# Patient Record
Sex: Female | Born: 1935 | Race: Black or African American | Hispanic: No | Smoking: Former smoker
Health system: Southern US, Community
[De-identification: ages and names within clinical notes are randomized; demographics above are authoritative.]

## PROBLEM LIST (undated history)

## (undated) DIAGNOSIS — B019 Varicella without complication: Secondary | ICD-10-CM

## (undated) DIAGNOSIS — Z972 Presence of dental prosthetic device (complete) (partial): Secondary | ICD-10-CM

## (undated) DIAGNOSIS — D649 Anemia, unspecified: Secondary | ICD-10-CM

## (undated) DIAGNOSIS — I10 Essential (primary) hypertension: Secondary | ICD-10-CM

## (undated) DIAGNOSIS — E785 Hyperlipidemia, unspecified: Secondary | ICD-10-CM

## (undated) DIAGNOSIS — K5730 Diverticulosis of large intestine without perforation or abscess without bleeding: Secondary | ICD-10-CM

## (undated) DIAGNOSIS — Z973 Presence of spectacles and contact lenses: Secondary | ICD-10-CM

## (undated) DIAGNOSIS — K219 Gastro-esophageal reflux disease without esophagitis: Secondary | ICD-10-CM

## (undated) HISTORY — DX: Anemia, unspecified: D64.9

## (undated) HISTORY — DX: Diverticulosis of large intestine without perforation or abscess without bleeding: K57.30

## (undated) HISTORY — DX: Varicella without complication: B01.9

## (undated) HISTORY — DX: Gastro-esophageal reflux disease without esophagitis: K21.9

## (undated) HISTORY — PX: BREAST LUMPECTOMY: SHX2

## (undated) HISTORY — PX: OTHER SURGICAL HISTORY: SHX169

## (undated) HISTORY — PX: ABDOMINAL HYSTERECTOMY: SHX81

## (undated) HISTORY — DX: Essential (primary) hypertension: I10

## (undated) HISTORY — DX: Hyperlipidemia, unspecified: E78.5

---

## 2009-02-27 ENCOUNTER — Ambulatory Visit: Payer: Self-pay | Admitting: Family Medicine

## 2009-03-07 ENCOUNTER — Ambulatory Visit: Payer: Self-pay | Admitting: Oncology

## 2009-03-12 LAB — CBC & DIFF AND RETIC
Basophils Absolute: 0.1 10*3/uL (ref 0.0–0.1)
EOS%: 3.4 % (ref 0.0–7.0)
Eosinophils Absolute: 0.1 10*3/uL (ref 0.0–0.5)
Immature Retic Fract: 8 % (ref 0.00–10.70)
NEUT%: 58.6 % (ref 38.4–76.8)
RBC: 4.04 10*6/uL (ref 3.70–5.45)
Retic %: 1.28 % (ref 0.50–1.50)
lymph#: 1.1 10*3/uL (ref 0.9–3.3)

## 2009-03-12 LAB — TECHNOLOGIST REVIEW

## 2009-03-14 LAB — COMPREHENSIVE METABOLIC PANEL
Albumin: 4.5 g/dL (ref 3.5–5.2)
Alkaline Phosphatase: 58 U/L (ref 39–117)
BUN: 21 mg/dL (ref 6–23)
Creatinine, Ser: 1.13 mg/dL (ref 0.40–1.20)
Glucose, Bld: 137 mg/dL — ABNORMAL HIGH (ref 70–99)
Potassium: 3.7 mEq/L (ref 3.5–5.3)
Total Bilirubin: 0.8 mg/dL (ref 0.3–1.2)
Total Protein: 7.2 g/dL (ref 6.0–8.3)

## 2009-03-14 LAB — LACTATE DEHYDROGENASE: LDH: 162 U/L (ref 94–250)

## 2009-03-14 LAB — SPEP & IFE WITH QIG
Albumin ELP: 58.4 % (ref 55.8–66.1)
Gamma Globulin: 17.5 % (ref 11.1–18.8)
IgM, Serum: 72 mg/dL (ref 60–263)
M-Spike, %: 0.23 g/dL

## 2009-03-14 LAB — IRON AND TIBC: %SAT: 28 % (ref 20–55)

## 2009-03-20 LAB — FECAL OCCULT BLOOD, GUAIAC: Occult Blood: NEGATIVE

## 2009-04-07 ENCOUNTER — Ambulatory Visit: Payer: Self-pay | Admitting: Oncology

## 2009-04-29 ENCOUNTER — Ambulatory Visit: Payer: Self-pay | Admitting: Family Medicine

## 2009-07-08 ENCOUNTER — Ambulatory Visit: Payer: Self-pay | Admitting: Oncology

## 2009-07-10 LAB — COMPREHENSIVE METABOLIC PANEL
AST: 29 U/L (ref 0–37)
Calcium: 10.1 mg/dL (ref 8.4–10.5)
Chloride: 103 mEq/L (ref 96–112)
Creatinine, Ser: 0.94 mg/dL (ref 0.40–1.20)
Potassium: 3.8 mEq/L (ref 3.5–5.3)
Total Protein: 7.4 g/dL (ref 6.0–8.3)

## 2009-07-10 LAB — CBC WITH DIFFERENTIAL/PLATELET
BASO%: 1.1 % (ref 0.0–2.0)
Basophils Absolute: 0.1 10*3/uL (ref 0.0–0.1)
MCH: 28.3 pg (ref 25.1–34.0)
MCHC: 32.5 g/dL (ref 31.5–36.0)
MCV: 87 fL (ref 79.5–101.0)
NEUT%: 46.2 % (ref 38.4–76.8)
RDW: 13.8 % (ref 11.2–14.5)
WBC: 4.5 10*3/uL (ref 3.9–10.3)

## 2009-07-28 ENCOUNTER — Ambulatory Visit: Payer: Self-pay | Admitting: Family Medicine

## 2009-12-03 ENCOUNTER — Ambulatory Visit: Payer: Self-pay | Admitting: Family Medicine

## 2009-12-09 ENCOUNTER — Encounter: Admission: RE | Admit: 2009-12-09 | Discharge: 2009-12-09 | Payer: Self-pay | Admitting: Obstetrics & Gynecology

## 2010-01-09 ENCOUNTER — Ambulatory Visit: Payer: Self-pay | Admitting: Oncology

## 2010-01-13 LAB — CBC WITH DIFFERENTIAL/PLATELET
BASO%: 0.9 % (ref 0.0–2.0)
Eosinophils Absolute: 0.2 10*3/uL (ref 0.0–0.5)
HCT: 37.8 % (ref 34.8–46.6)
LYMPH%: 43.5 % (ref 14.0–49.7)
MCHC: 33.6 g/dL (ref 31.5–36.0)
MONO#: 0.4 10*3/uL (ref 0.1–0.9)
MONO%: 8.2 % (ref 0.0–14.0)
NEUT#: 1.9 10*3/uL (ref 1.5–6.5)
RBC: 4.36 10*6/uL (ref 3.70–5.45)
WBC: 4.3 10*3/uL (ref 3.9–10.3)
lymph#: 1.9 10*3/uL (ref 0.9–3.3)

## 2010-01-13 LAB — COMPREHENSIVE METABOLIC PANEL
Albumin: 4.3 g/dL (ref 3.5–5.2)
Alkaline Phosphatase: 50 U/L (ref 39–117)
BUN: 15 mg/dL (ref 6–23)
CO2: 27 mEq/L (ref 19–32)
Chloride: 101 mEq/L (ref 96–112)
Creatinine, Ser: 1.06 mg/dL (ref 0.40–1.20)
Potassium: 3.8 mEq/L (ref 3.5–5.3)
Total Bilirubin: 0.8 mg/dL (ref 0.3–1.2)

## 2010-04-02 ENCOUNTER — Ambulatory Visit: Payer: Self-pay | Admitting: Family Medicine

## 2010-07-02 ENCOUNTER — Ambulatory Visit (INDEPENDENT_AMBULATORY_CARE_PROVIDER_SITE_OTHER): Payer: Federal, State, Local not specified - PPO | Admitting: Family Medicine

## 2010-07-02 DIAGNOSIS — I10 Essential (primary) hypertension: Secondary | ICD-10-CM

## 2010-07-02 DIAGNOSIS — E119 Type 2 diabetes mellitus without complications: Secondary | ICD-10-CM

## 2010-07-02 DIAGNOSIS — E7800 Pure hypercholesterolemia, unspecified: Secondary | ICD-10-CM

## 2010-07-02 DIAGNOSIS — Z79899 Other long term (current) drug therapy: Secondary | ICD-10-CM

## 2010-07-29 ENCOUNTER — Encounter (HOSPITAL_BASED_OUTPATIENT_CLINIC_OR_DEPARTMENT_OTHER): Payer: Federal, State, Local not specified - PPO | Admitting: Oncology

## 2010-07-29 ENCOUNTER — Other Ambulatory Visit: Payer: Self-pay | Admitting: Oncology

## 2010-07-29 DIAGNOSIS — D696 Thrombocytopenia, unspecified: Secondary | ICD-10-CM

## 2010-07-29 DIAGNOSIS — D649 Anemia, unspecified: Secondary | ICD-10-CM

## 2010-07-29 LAB — CBC WITH DIFFERENTIAL/PLATELET
BASO%: 0.7 % (ref 0.0–2.0)
HCT: 37.9 % (ref 34.8–46.6)
LYMPH%: 50.9 % — ABNORMAL HIGH (ref 14.0–49.7)
MCH: 30.1 pg (ref 25.1–34.0)
MCHC: 33.9 g/dL (ref 31.5–36.0)
MCV: 88.6 fL (ref 79.5–101.0)
MONO#: 0.2 10*3/uL (ref 0.1–0.9)
MONO%: 4.7 % (ref 0.0–14.0)
NEUT#: 1.6 10*3/uL (ref 1.5–6.5)
NEUT%: 39.3 % (ref 38.4–76.8)
Platelets: 107 10*3/uL — ABNORMAL LOW (ref 145–400)
RBC: 4.27 10*6/uL (ref 3.70–5.45)
RDW: 14.3 % (ref 11.2–14.5)
WBC: 4 10*3/uL (ref 3.9–10.3)
lymph#: 2 10*3/uL (ref 0.9–3.3)

## 2010-07-29 LAB — IRON AND TIBC
TIBC: 323 ug/dL (ref 250–470)
UIBC: 218 ug/dL

## 2010-08-01 ENCOUNTER — Encounter: Payer: Self-pay | Admitting: Family Medicine

## 2010-09-02 ENCOUNTER — Ambulatory Visit: Payer: Federal, State, Local not specified - PPO | Admitting: Family Medicine

## 2010-11-09 ENCOUNTER — Telehealth: Payer: Self-pay | Admitting: *Deleted

## 2010-11-09 NOTE — Telephone Encounter (Signed)
Called patient to try to schedule appt per Dr.Knapp as pt canceled appt from 09/02/10. Patient stated that she is going to transfer out to another doctor and would not schedule appointment.

## 2011-01-14 ENCOUNTER — Encounter (HOSPITAL_BASED_OUTPATIENT_CLINIC_OR_DEPARTMENT_OTHER): Payer: Federal, State, Local not specified - PPO | Admitting: Oncology

## 2011-01-14 ENCOUNTER — Other Ambulatory Visit: Payer: Self-pay | Admitting: Oncology

## 2011-01-14 DIAGNOSIS — D649 Anemia, unspecified: Secondary | ICD-10-CM

## 2011-01-14 DIAGNOSIS — D696 Thrombocytopenia, unspecified: Secondary | ICD-10-CM

## 2011-01-14 LAB — CBC WITH DIFFERENTIAL/PLATELET
BASO%: 0.8 % (ref 0.0–2.0)
HCT: 38.1 % (ref 34.8–46.6)
MCHC: 33.8 g/dL (ref 31.5–36.0)
MCV: 88.5 fL (ref 79.5–101.0)
MONO%: 5.7 % (ref 0.0–14.0)
NEUT#: 2.2 10*3/uL (ref 1.5–6.5)
Platelets: 108 10*3/uL — ABNORMAL LOW (ref 145–400)
RDW: 14 % (ref 11.2–14.5)
WBC: 4.7 10*3/uL (ref 3.9–10.3)

## 2011-02-08 ENCOUNTER — Ambulatory Visit (INDEPENDENT_AMBULATORY_CARE_PROVIDER_SITE_OTHER): Payer: Federal, State, Local not specified - PPO | Admitting: Internal Medicine

## 2011-02-08 ENCOUNTER — Encounter: Payer: Self-pay | Admitting: Internal Medicine

## 2011-02-08 VITALS — BP 156/90 | HR 75 | Temp 97.9°F | Ht 62.0 in | Wt 128.0 lb

## 2011-02-08 DIAGNOSIS — Z23 Encounter for immunization: Secondary | ICD-10-CM

## 2011-02-08 DIAGNOSIS — G4700 Insomnia, unspecified: Secondary | ICD-10-CM

## 2011-02-08 DIAGNOSIS — E785 Hyperlipidemia, unspecified: Secondary | ICD-10-CM

## 2011-02-08 DIAGNOSIS — D649 Anemia, unspecified: Secondary | ICD-10-CM | POA: Insufficient documentation

## 2011-02-08 DIAGNOSIS — I10 Essential (primary) hypertension: Secondary | ICD-10-CM | POA: Insufficient documentation

## 2011-02-08 DIAGNOSIS — E119 Type 2 diabetes mellitus without complications: Secondary | ICD-10-CM

## 2011-02-08 NOTE — Assessment & Plan Note (Signed)
Reviewed rules of sleep hygiene: regular hours; avoidance of all stimulants; regular exercise; sleep santuary - no other activities in bed; avoidance of extinction behavior - laying in bed awake, naps, etc.   Plan - improved sleep hygiene           Medications if this fails.

## 2011-02-08 NOTE — Progress Notes (Signed)
Subjective:    Patient ID: Samantha Norton, female    DOB: Nov 12, 1931, 77 y.o.   MRN: [id number]  HPI Mrs. Spurling to establish for primary care. She is feeling well and has no specific complaints.  She does report a problem with insomnia: both sleep latency and duration. Oriented to sleep hygiene.   Past Medical History  Diagnosis Date  . Hypertension   . Diabetes mellitus   . Hyperlipidemia   . Varicella     in high school  . Anemia     thrombocytopenia, seen by hematology.  . Diverticulosis of colon   . GERD (gastroesophageal reflux disease)     no medications. Last EGD 2+ years ago. Had H. Pylori  . Osteoporosis     by patient report - osteopenia   Past Surgical History  Procedure Date  . Breast lumpectomy     bilateral biospsies over time x 3-4 , always beng  . Tonsilectomy   . Abdominal hysterectomy     partial, due to fibroid tumors.   Family History  Problem Relation Age of Onset  . Hypertension Mother   . Stroke Mother   . Diabetes Mother   . Stroke Father   . Hypertension Father   . Arthritis Brother     hip problems, in w/c  . Cancer Daughter     breast   History   Social History  . Marital Status: Divorced    Spouse Name: N/A    Number of Children: 7  . Years of Education: 13   Occupational History  . Postal clerk     retired at 39 years   Social History Main Topics  . Smoking status: Former Smoker    Quit date: 02/08/1972  . Smokeless tobacco: Never Used  . Alcohol Use: Yes     rare glass of wine  . Drug Use: No  . Sexually Active: Not Currently   Other Topics Concern  . Not on file   Social History Narrative   HSG, 1 year [employer]. Married '52 - 22 yrs/divorced. 4 drtrs  '52, '54, '58, '60; 3 sons - '59, '62, '66; grandchildren 16; a lot of great-grands 15. Native of [place]. 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 Closest relative - a son in [place]. No futile or heroic measures but not decided about the details of CPR.  Would not want long-term mechanical ventilation.          Review of Systems Constitutional:  Negative for fever, chills, activity change and unexpected weight change.  HEENT:  Negative for hearing loss, ear pain, congestion, neck stiffness and postnasal drip. Negative for sore throat or swallowing problems. Negative for dental complaints.   Eyes: Negative for vision loss or change in visual acuity.  Respiratory: Negative for chest tightness and wheezing. Negative for DOE.   Cardiovascular: Negative for chest pain or palpitations. No decreased exercise tolerance Gastrointestinal: No change in bowel habit. No bloating or gas. No reflux or indigestion Genitourinary: Negative for urgency, frequency, flank pain and difficulty urinating.  Musculoskeletal: Negative for myalgias, back pain, arthralgias and gait problem. Positive for contracture right hand, less so left hand. Neurological: Negative for dizziness, tremors, weakness and headaches.  Hematological: Negative for adenopathy.  Psychiatric/Behavioral: Negative for behavioral problems and dysphoric mood.       Objective:   Physical Exam Vitals reviewed - mild elevation in BP Gen'l - WNWD AA woman in no distress HEENT- [place]/AT, C&S clear, Right eye - recent  cataract surgery. Left eye appears normal. Oropharynx - upper denture, normal dentition mandible. Neck- supple, w/o thyromegaly Chest- no deformity Lungs - CTAP, no wheezes Cor- RRR without murmurs or gallops Breast exam - deferred to gyn Abdomen- soft, no guarding or rebound, no HSM Pelvic/rectal deferred to gyn Ext - normal Skin clear. Neuro - A&O x 3, CN II-XII normal, normal strength, normal gait.              Assessment & Plan:

## 2011-02-09 NOTE — Assessment & Plan Note (Signed)
Current with hematology and this problem is stable.

## 2011-02-09 NOTE — Assessment & Plan Note (Signed)
BP Readings from Last 3 Encounters:  02/08/11 156/90  07/02/10 164/98   Suboptimal control on ARB/hct.  Plan - home monitoring with report back. If home readings confirm elevation will need to address medication change.

## 2011-02-09 NOTE — Assessment & Plan Note (Signed)
Cholesterol panel ordered and pending. Recommendations to be based on lab results.

## 2011-03-11 ENCOUNTER — Other Ambulatory Visit: Payer: Self-pay | Admitting: Internal Medicine

## 2011-03-11 ENCOUNTER — Other Ambulatory Visit (INDEPENDENT_AMBULATORY_CARE_PROVIDER_SITE_OTHER): Payer: Federal, State, Local not specified - PPO

## 2011-03-11 ENCOUNTER — Ambulatory Visit (INDEPENDENT_AMBULATORY_CARE_PROVIDER_SITE_OTHER)
Admission: RE | Admit: 2011-03-11 | Discharge: 2011-03-11 | Disposition: A | Discharge status: Home / Self Care | Payer: Federal, State, Local not specified - PPO | Source: Ambulatory Visit | Attending: Internal Medicine | Admitting: Internal Medicine

## 2011-03-11 ENCOUNTER — Ambulatory Visit (INDEPENDENT_AMBULATORY_CARE_PROVIDER_SITE_OTHER): Payer: Federal, State, Local not specified - PPO | Admitting: Internal Medicine

## 2011-03-11 VITALS — BP 142/82 | HR 67 | Temp 98.0°F | Ht 62.0 in | Wt 132.0 lb

## 2011-03-11 DIAGNOSIS — E785 Hyperlipidemia, unspecified: Secondary | ICD-10-CM

## 2011-03-11 DIAGNOSIS — E119 Type 2 diabetes mellitus without complications: Secondary | ICD-10-CM

## 2011-03-11 DIAGNOSIS — M549 Dorsalgia, unspecified: Secondary | ICD-10-CM

## 2011-03-11 DIAGNOSIS — I10 Essential (primary) hypertension: Secondary | ICD-10-CM

## 2011-03-11 DIAGNOSIS — D649 Anemia, unspecified: Secondary | ICD-10-CM

## 2011-03-11 LAB — COMPREHENSIVE METABOLIC PANEL
ALT: 22 U/L (ref 0–35)
AST: 28 U/L (ref 0–37)
Albumin: 4 g/dL (ref 3.5–5.2)
Alkaline Phosphatase: 53 U/L (ref 39–117)
BUN: 17 mg/dL (ref 6–23)
Calcium: 9.8 mg/dL (ref 8.4–10.5)
GFR: 76.98 mL/min (ref >60.00)
Potassium: 3.7 mEq/L (ref 3.5–5.1)
Total Bilirubin: 0.9 mg/dL (ref 0.3–1.2)
Total Protein: 7.1 g/dL (ref 6.0–8.3)

## 2011-03-11 LAB — LIPID PANEL
Cholesterol: 244 mg/dL — ABNORMAL HIGH (ref 0–200)
HDL: 70.7 mg/dL (ref >39.00)

## 2011-03-11 LAB — HEMOGLOBIN A1C: Hgb A1c MFr Bld: 6.2 % (ref 4.6–6.5)

## 2011-03-11 MED ORDER — IRBESARTAN-HYDROCHLOROTHIAZIDE 300-25 MG PO TABS: 1.0000 | ORAL_TABLET | Freq: Every day | ORAL | Status: DC

## 2011-03-11 NOTE — Progress Notes (Signed)
  Subjective:    Patient ID: Samantha Norton, female    DOB: November 01, 1931, 77 y.o.   MRN: [id number]  HPI Mrs. Rotolo returns for new patient follow-up. She did not receive a copy of the original office note, which is given to her today. She was to have lab work for cholesterol, anemia and hypertension which she forgot to do.   Her chief c/o today is a tenderness in the mid back. She has no limitation in her activities. There is no radiation of discomfort. She has had no injur.  I have reviewed the patient's medical history in detail and updated the computerized patient record.    Review of Systems System review is negative for any constitutional, cardiac, pulmonary, GI or neuro symptoms or complaints other than as described in the HPI.    Objective:   Physical Exam Vitals - normal Gen'l - WNWD AA woman Pulm - normal respirations Cor - RRR Back - tender at T9-10 level in the soft tissue to the left of midline.        Assessment & Plan:

## 2011-03-11 NOTE — Patient Instructions (Signed)
Thanks for coming back. If you have any questions about the report I gave you please let me know.  For your back discomfort - will check an x-ray today.  Please get your lab work today - you will receive a report and recommendations as appropriate.

## 2011-03-11 NOTE — Assessment & Plan Note (Signed)
For lab with recommendations to follow. 

## 2011-03-11 NOTE — Assessment & Plan Note (Signed)
BP Readings from Last 3 Encounters:  03/11/11 142/82  02/08/11 156/90  07/02/10 164/98   Better control today.  Renewed avalide She is to go for lab today

## 2011-03-11 NOTE — Assessment & Plan Note (Signed)
For lab today with copy to hematology

## 2011-03-15 ENCOUNTER — Telehealth: Payer: Self-pay | Admitting: *Deleted

## 2011-03-15 NOTE — Telephone Encounter (Signed)
avalide does not come in strength of 300/25 does in 300/12.5 please Advise  [phone] reference number [id number]

## 2011-03-15 NOTE — Telephone Encounter (Signed)
300/12.5 is ok, # 30 refill prn. Correct MAR

## 2011-03-16 MED ORDER — IRBESARTAN-HYDROCHLOROTHIAZIDE 300-12.5 MG PO TABS: 1.0000 | ORAL_TABLET | Freq: Every day | ORAL | Status: DC

## 2011-03-16 NOTE — Telephone Encounter (Signed)
Spoke with Clydie Braun at [facility] and verified that Avalide 300/12.5 mg

## 2011-03-22 ENCOUNTER — Encounter: Payer: Self-pay | Admitting: Internal Medicine

## 2011-04-15 ENCOUNTER — Ambulatory Visit (INDEPENDENT_AMBULATORY_CARE_PROVIDER_SITE_OTHER): Payer: Federal, State, Local not specified - PPO | Admitting: Internal Medicine

## 2011-04-15 ENCOUNTER — Encounter: Payer: Self-pay | Admitting: Internal Medicine

## 2011-04-15 VITALS — BP 128/88 | HR 55 | Temp 98.3°F | Resp 14 | Wt 131.1 lb

## 2011-04-15 DIAGNOSIS — E785 Hyperlipidemia, unspecified: Secondary | ICD-10-CM

## 2011-04-15 NOTE — Patient Instructions (Signed)
Cholesterol - the total cholestero @ 244 is above normal of 200. The HDL (good cholesterol) is 70, with normal being 40 +, the LDL is 152.6 with a goal of 130 or less. Your LDL/HDL is 2.2 with average risk for heart disease being 3-3.5. The 10 year risk of a cardiac event using the Framingham cardiac risk calculator that is based on the total cholesterol, the HDL, the systolic blood pressure, age and gender is 7% - low risk category.  Recommendation - continue a healthy low fat diet, improve it if you can; continue to walk. Repeat panel in 6 months  Bl,ood pressure - good control. Chest x-ray does reveal mild to moderate left ventricular hypertrophy - boot shaped heart-due to long standing high blood pressure.  Overall I would say you are really good condition for the condition that you are in.

## 2011-04-18 NOTE — Assessment & Plan Note (Signed)
Lab Results  Component Value Date   CHOL 244* 03/11/2011   HDL 70.70 03/11/2011   LDLDIRECT 152.6 03/11/2011   TRIG 68.0 03/11/2011   CHOLHDL 3 03/11/2011   Reviewed NCEP guidelines with her. NCEP cardiac risk calculation w/ a 10 year risk of 7%  = low risk.  Plan- life-style management: diet and continued exercise.          Recheck lipids 6 months  (greater than 50% of 20 min  visit spent on education and counseling)

## 2011-04-18 NOTE — Progress Notes (Signed)
  Subjective:    Patient ID: Samantha Norton, female    DOB: 1931-11-05, 78 y.o.   MRN: [id number]  HPI Patient presents to discuss recent labs with a elevated LDL cholesterol. She is feeling well.  I have reviewed the patient's medical history in detail and updated the computerized patient record.  Review of Systems System review is negative for any constitutional, cardiac, pulmonary, GI or neuro symptoms or complaints other than as described in the HPI.     Objective:   Physical Exam Filed Vitals:   04/15/11 1329  BP: 128/88  Pulse: 55  Temp: 98.3 F (36.8 C)  Resp: 14   WNWD AA woman in no distress Pulm- normal respirations Cor - RRR Neuro - bright and alert.       Assessment & Plan:

## 2011-05-11 ENCOUNTER — Telehealth: Payer: Self-pay | Admitting: Oncology

## 2011-05-11 NOTE — Telephone Encounter (Signed)
lmonvm adviisng the pt of her march 2013 appts °

## 2011-08-11 ENCOUNTER — Ambulatory Visit (HOSPITAL_BASED_OUTPATIENT_CLINIC_OR_DEPARTMENT_OTHER): Payer: Federal, State, Local not specified - PPO | Admitting: Oncology

## 2011-08-11 ENCOUNTER — Other Ambulatory Visit (HOSPITAL_BASED_OUTPATIENT_CLINIC_OR_DEPARTMENT_OTHER): Payer: Federal, State, Local not specified - PPO | Admitting: Lab

## 2011-08-11 ENCOUNTER — Telehealth: Payer: Self-pay | Admitting: Oncology

## 2011-08-11 VITALS — BP 193/90 | HR 62 | Temp 97.6°F | Ht 62.0 in | Wt 132.8 lb

## 2011-08-11 DIAGNOSIS — D649 Anemia, unspecified: Secondary | ICD-10-CM

## 2011-08-11 DIAGNOSIS — D696 Thrombocytopenia, unspecified: Secondary | ICD-10-CM

## 2011-08-11 LAB — CBC WITH DIFFERENTIAL/PLATELET
BASO%: 2.1 % — ABNORMAL HIGH (ref 0.0–2.0)
Eosinophils Absolute: 0.2 10*3/uL (ref 0.0–0.5)
HCT: 37.7 % (ref 34.8–46.6)
LYMPH%: 39.8 % (ref 14.0–49.7)
MCHC: 32.3 g/dL (ref 31.5–36.0)
MONO#: 0.3 10*3/uL (ref 0.1–0.9)
NEUT%: 47.4 % (ref 38.4–76.8)
Platelets: 102 10*3/uL — ABNORMAL LOW (ref 145–400)
WBC: 3.9 10*3/uL (ref 3.9–10.3)

## 2011-08-11 LAB — COMPREHENSIVE METABOLIC PANEL
BUN: 20 mg/dL (ref 6–23)
CO2: 30 mEq/L (ref 19–32)
Calcium: 9.7 mg/dL (ref 8.4–10.5)
Chloride: 103 mEq/L (ref 96–112)
Creatinine, Ser: 0.95 mg/dL (ref 0.50–1.10)

## 2011-08-11 NOTE — Progress Notes (Signed)
Hematology and Oncology Follow Up Visit  Shandelle Borrelli [id number] [birth year] 78 y.o. 08/11/2011 3:28 PM     Principle Diagnosis: This is a 78 year old female initially diagnosed with normocytic normochromic anemia and mild thrombocytopenia diagnosed in 2010.  Current therapy: Observation and surveillance.  Her hemoglobins have normalized.  Interim History:  Ms. Olivier presents today for a followup visit.  This is a pleasant 78 year old female who we saw initially back in December 2010 for evaluation of her  anemia and thrombocytopenia.  Her hemoglobins have been as high as 12.9 since that time and did not really require any intervention.  She does have mild thrombocytopenia as well.  Platelet count ranged between 100-120,000.  Since the last time I saw her, she had not reported any major changes in her performance status, had not had any appetite changes, had not had any constitutional symptoms. She did report very small bruises on her arm that are noticeable today. No bleeding noted.   Activity level remains stable.   Medications: I have reviewed the patient's current medications. Current outpatient prescriptions:aspirin 81 MG tablet, Take 81 mg by mouth daily.  , Disp: , Rfl: ;  calcium-vitamin D (OSCAL WITH D) 250-125 MG-UNIT per tablet, Take 1 tablet by mouth daily.  , Disp: , Rfl: ;  irbesartan-hydrochlorothiazide (AVALIDE) 300-12.5 MG per tablet, Take 1 tablet by mouth daily., Disp: 90 tablet, Rfl: 3;  irbesartan-hydrochlorothiazide (AVALIDE) 300-25 MG per tablet, Take 1 tablet by mouth daily., Disp: 90 tablet, Rfl: 3 multivitamin (THERAGRAN) per tablet, Take 1 tablet by mouth daily.  , Disp: , Rfl: ;  omega-3 acid ethyl esters (LOVAZA) 1 G capsule, Take 1 g by mouth daily.  , Disp: , Rfl:   Allergies: No Known Allergies  Past Medical History, Surgical history, Social history, and Family History were reviewed and updated.  Review of Systems: Constitutional:  Negative for fever, chills, night  sweats, anorexia, weight loss, pain. Cardiovascular: no chest pain or dyspnea on exertion Respiratory: negative Neurological: negative Dermatological: negative ENT: negative Skin: Negative. Gastrointestinal: negative Genito-Urinary: negative Hematological and Lymphatic: negative Breast: negative Musculoskeletal: negative Remaining ROS negative. Physical Exam: Blood pressure 193/90, pulse 62, temperature 97.6 F (36.4 C), temperature source Oral, height 5\' 2"  (1.575 m), weight 132 lb 12.8 oz (60.238 kg). ECOG: 1 General appearance: alert Head: Normocephalic, without obvious abnormality, atraumatic Neck: no adenopathy, no carotid bruit, no JVD, supple, symmetrical, trachea midline and thyroid not enlarged, symmetric, no tenderness/mass/nodules Lymph nodes: Cervical, supraclavicular, and axillary nodes normal. Heart:regular rate and rhythm, S1, S2 normal, no murmur, click, rub or gallop Lung:chest clear, no wheezing, rales, normal symmetric air entry. Abdomin: soft, non-tender, without masses or organomegaly EXT:no erythema, induration, or nodules Skin: no rash, no petechia    Lab Results: Lab Results  Component Value Date   WBC 3.9 08/11/2011   HGB 12.2 08/11/2011   HCT 37.7 08/11/2011   MCV 89.4 08/11/2011   PLT 102* 08/11/2011     Chemistry      Component Value Date/Time   NA 142 03/11/2011 1224   K 3.7 03/11/2011 1224   CL 107 03/11/2011 1224   CO2 29 03/11/2011 1224   BUN 17 03/11/2011 1224   CREATININE 0.9 03/11/2011 1224      Component Value Date/Time   CALCIUM 9.8 03/11/2011 1224   ALKPHOS 53 03/11/2011 1224   AST 28 03/11/2011 1224   ALT 22 03/11/2011 1224   BILITOT 0.9 03/11/2011 1224       Impression and Plan:  This is a 78 year old female with the following issues. 1. Anemia.  It was normochromic normocytic in the past, but have been really normal in the last 2 years.  I do not see any evidence of any vitamin deficiency.  Really no further workup in  management from an anemia standpoint at this time. 2. Mild thrombocytopenia.  Platelet counts have been above 100,000.  Differential diagnosis include medication versus autoimmune phenomenon such as idiopathic thrombocytopenic purpura.  I doubt that this is an early sign of myelodysplasia, but will continue to monitor it.  Repeat blood counts in about 6 months.   [facility], MD 5/1/20133:28 PM

## 2011-08-11 NOTE — Telephone Encounter (Signed)
Gv pt appt for nov2013 °

## 2011-10-05 ENCOUNTER — Other Ambulatory Visit (INDEPENDENT_AMBULATORY_CARE_PROVIDER_SITE_OTHER): Payer: Federal, State, Local not specified - PPO

## 2011-10-05 DIAGNOSIS — E785 Hyperlipidemia, unspecified: Secondary | ICD-10-CM

## 2011-10-05 LAB — LIPID PANEL
Cholesterol: 213 mg/dL — ABNORMAL HIGH (ref 0–200)
HDL: 74.5 mg/dL (ref >39.00)
VLDL: 16.2 mg/dL (ref 0.0–40.0)

## 2011-10-10 ENCOUNTER — Encounter: Payer: Self-pay | Admitting: Internal Medicine

## 2011-12-27 ENCOUNTER — Telehealth: Payer: Self-pay | Admitting: Internal Medicine

## 2011-12-27 NOTE — Telephone Encounter (Signed)
Forward 7 pages from [facility] ENT to Dr. Illene Regulus for review on 12-27-11 ym

## 2011-12-27 NOTE — Telephone Encounter (Signed)
Forward 4 pages from [facility] ENT to Dr. Illene Regulus for review on 12-27-11 ym

## 2012-02-11 ENCOUNTER — Telehealth: Payer: Self-pay | Admitting: Oncology

## 2012-02-11 ENCOUNTER — Ambulatory Visit (HOSPITAL_BASED_OUTPATIENT_CLINIC_OR_DEPARTMENT_OTHER): Payer: Federal, State, Local not specified - PPO | Admitting: Oncology

## 2012-02-11 ENCOUNTER — Other Ambulatory Visit (HOSPITAL_BASED_OUTPATIENT_CLINIC_OR_DEPARTMENT_OTHER): Payer: Federal, State, Local not specified - PPO | Admitting: Lab

## 2012-02-11 VITALS — BP 171/78 | HR 60 | Temp 98.5°F | Resp 20 | Ht 62.0 in | Wt 130.5 lb

## 2012-02-11 DIAGNOSIS — D696 Thrombocytopenia, unspecified: Secondary | ICD-10-CM

## 2012-02-11 DIAGNOSIS — D649 Anemia, unspecified: Secondary | ICD-10-CM

## 2012-02-11 LAB — COMPREHENSIVE METABOLIC PANEL (CC13)
ALT: 40 U/L (ref 0–55)
AST: 38 U/L — ABNORMAL HIGH (ref 5–34)
Albumin: 3.8 g/dL (ref 3.5–5.0)
Alkaline Phosphatase: 67 U/L (ref 40–150)
Glucose: 165 mg/dl — ABNORMAL HIGH (ref 70–99)
Potassium: 4.1 mEq/L (ref 3.5–5.1)
Sodium: 141 mEq/L (ref 136–145)
Total Bilirubin: 0.82 mg/dL (ref 0.20–1.20)
Total Protein: 7 g/dL (ref 6.4–8.3)

## 2012-02-11 LAB — CHCC SMEAR

## 2012-02-11 LAB — CBC WITH DIFFERENTIAL/PLATELET
BASO%: 1.8 % (ref 0.0–2.0)
LYMPH%: 38.3 % (ref 14.0–49.7)
MCHC: 33.3 g/dL (ref 31.5–36.0)
MCV: 88.5 fL (ref 79.5–101.0)
MONO%: 6.6 % (ref 0.0–14.0)
Platelets: 103 10*3/uL — ABNORMAL LOW (ref 145–400)
RBC: 4.31 10*6/uL (ref 3.70–5.45)

## 2012-02-11 NOTE — Telephone Encounter (Signed)
appts made and printed for pt  °

## 2012-02-11 NOTE — Progress Notes (Signed)
Hematology and Oncology Follow Up Visit  Samantha Norton [id number] Nov 10, 1931 78 y.o. 02/11/2012 3:10 PM     Principle Diagnosis: This is a 78 year old female initially diagnosed with normocytic normochromic anemia and mild thrombocytopenia diagnosed in 2010.  Current therapy: Observation and surveillance.  Her hemoglobins have normalized.  Interim History:  Samantha Norton presents today for a followup visit.  This is a pleasant 78 year old female who we saw initially back in December 2010 for evaluation of her  anemia and thrombocytopenia.  Her hemoglobins have been as high as 12.9 since that time and did not really require any intervention.  She does have mild thrombocytopenia as well.  Platelet count ranged between 100-120,000.  Since the last time I saw her, she had not reported any major changes in her performance status, had not had any appetite changes, had not had any constitutional symptoms.  Activity level remains stable at this point.    Medications: I have reviewed the patient's current medications. Current outpatient prescriptions:aspirin 81 MG tablet, Take 81 mg by mouth daily.  , Disp: , Rfl: ;  calcium-vitamin D (OSCAL WITH D) 250-125 MG-UNIT per tablet, Take 1 tablet by mouth daily.  , Disp: , Rfl: ;  irbesartan-hydrochlorothiazide (AVALIDE) 300-12.5 MG per tablet, Take 1 tablet by mouth daily., Disp: 90 tablet, Rfl: 3;  irbesartan-hydrochlorothiazide (AVALIDE) 300-25 MG per tablet, Take 1 tablet by mouth daily., Disp: 90 tablet, Rfl: 3 multivitamin (THERAGRAN) per tablet, Take 1 tablet by mouth daily.  , Disp: , Rfl: ;  omega-3 acid ethyl esters (LOVAZA) 1 G capsule, Take 1 g by mouth daily.  , Disp: , Rfl:   Allergies: No Known Allergies  Past Medical History, Surgical history, Social history, and Family History were reviewed and updated.  Review of Systems: Constitutional:  Negative for fever, chills, night sweats, anorexia, weight loss, pain. Cardiovascular: no chest pain or dyspnea  on exertion Respiratory: negative Neurological: negative Dermatological: negative ENT: negative Skin: Negative. Gastrointestinal: negative Genito-Urinary: negative Hematological and Lymphatic: negative Breast: negative Musculoskeletal: negative Remaining ROS negative. Physical Exam: Blood pressure 171/78, pulse 60, temperature 98.5 F (36.9 C), temperature source Oral, resp. rate 20, height 5\' 2"  (1.575 m), weight 130 lb 8 oz (59.194 kg). ECOG: 1 General appearance: alert Head: Normocephalic, without obvious abnormality, atraumatic Neck: no adenopathy, no carotid bruit, no JVD, supple, symmetrical, trachea midline and thyroid not enlarged, symmetric, no tenderness/mass/nodules Lymph nodes: Cervical, supraclavicular, and axillary nodes normal. Heart:regular rate and rhythm, S1, S2 normal, no murmur, click, rub or gallop Lung:chest clear, no wheezing, rales, normal symmetric air entry. Abdomin: soft, non-tender, without masses or organomegaly EXT:no erythema, induration, or nodules Skin: no rash, no petechia    Lab Results: Lab Results  Component Value Date   WBC 4.0 02/11/2012   HGB 12.7 02/11/2012   HCT 38.1 02/11/2012   MCV 88.5 02/11/2012   PLT 103* 02/11/2012     Chemistry      Component Value Date/Time   NA 141 08/11/2011 1458   K 3.6 08/11/2011 1458   CL 103 08/11/2011 1458   CO2 30 08/11/2011 1458   BUN 20 08/11/2011 1458   CREATININE 0.95 08/11/2011 1458      Component Value Date/Time   CALCIUM 9.7 08/11/2011 1458   ALKPHOS 59 08/11/2011 1458   AST 30 08/11/2011 1458   ALT 24 08/11/2011 1458   BILITOT 0.5 08/11/2011 1458       Impression and Plan:  This is a 78 year old female with the following issues.  1. Anemia.  It was normochromic normocytic in the past, but have been really normal in the last 3 years.  I do not see any evidence of any vitamin deficiency.  Really no further workup in management from an anemia standpoint at this time. 2. Mild thrombocytopenia.  Platelet  counts have been above 100,000.  Differential diagnosis include medication versus autoimmune phenomenon such as idiopathic thrombocytopenic purpura.  I doubt that this is an early sign of myelodysplasia, but will continue to monitor it.  Repeat blood counts in about 6 months.   Kristy Catoe, MD 11/1/20133:10 PM

## 2012-03-17 ENCOUNTER — Encounter: Payer: Self-pay | Admitting: Internal Medicine

## 2012-03-17 ENCOUNTER — Ambulatory Visit (INDEPENDENT_AMBULATORY_CARE_PROVIDER_SITE_OTHER): Payer: Federal, State, Local not specified - PPO | Admitting: Internal Medicine

## 2012-03-17 VITALS — BP 152/88 | HR 66 | Temp 97.7°F | Ht 62.0 in | Wt 131.0 lb

## 2012-03-17 DIAGNOSIS — M79609 Pain in unspecified limb: Secondary | ICD-10-CM

## 2012-03-17 DIAGNOSIS — M79643 Pain in unspecified hand: Secondary | ICD-10-CM

## 2012-03-17 MED ORDER — PREDNISONE 20 MG PO TABS: 20.0000 mg | ORAL_TABLET | Freq: Every day | ORAL | Status: DC

## 2012-03-17 NOTE — Progress Notes (Signed)
Subjective:    Patient ID: Samantha Norton, female    DOB: June 28, 1931, 78 y.o.   MRN: [id number]  HPI  Pt presents to the clinic with c/o hand pain. This has been going on for over three years. She is starting to have problems due to the tenosynovitis in her hands. She is now experiencing numbness and tingling in all of her fingers. It is occuring more frequently. It is not necessarily too painful but she would like to have it looked at. She denies an injury to the area.  Review of Systems      Past Medical History  Diagnosis Date  . Hypertension   . Diabetes mellitus   . Hyperlipidemia   . Varicella     in high school  . Anemia     thrombocytopenia, seen by hematology.  . Diverticulosis of colon   . GERD (gastroesophageal reflux disease)     no medications. Last EGD 2+ years ago. Had H. Pylori  . Osteoporosis     by patient report - osteopenia    Current Outpatient Prescriptions  Medication Sig Dispense Refill  . aspirin 81 MG tablet Take 81 mg by mouth daily.        . calcium-vitamin D (OSCAL WITH D) 250-125 MG-UNIT per tablet Take 1 tablet by mouth daily.        . irbesartan-hydrochlorothiazide (AVALIDE) 300-12.5 MG per tablet Take 1 tablet by mouth daily.  90 tablet  3  . irbesartan-hydrochlorothiazide (AVALIDE) 300-25 MG per tablet Take 1 tablet by mouth daily.  90 tablet  3  . multivitamin (THERAGRAN) per tablet Take 1 tablet by mouth daily.        Marland Kitchen omega-3 acid ethyl esters (LOVAZA) 1 G capsule Take 1 g by mouth daily.          No Known Allergies  Family History  Problem Relation Age of Onset  . Hypertension Mother   . Stroke Mother   . Diabetes Mother   . Stroke Father   . Hypertension Father   . Arthritis Brother     hip problems, in w/c  . Cancer Daughter     breast    History   Social History  . Marital Status: Divorced    Spouse Name: N/A    Number of Children: 7  . Years of Education: 13   Occupational History  . Postal clerk     retired at 39  years   Social History Main Topics  . Smoking status: Former Smoker    Quit date: 02/08/1972  . Smokeless tobacco: Never Used  . Alcohol Use: Yes     Comment: rare glass of wine  . Drug Use: No  . Sexually Active: Not Currently   Other Topics Concern  . Not on file   Social History Narrative   HSG, 1 year [employer]. Married '52 - 22 yrs/divorced. 4 drtrs  '52, '54, '58, '60; 3 sons - '59, '62, '66; grandchildren 16; a lot of great-grands 15. Native of [place]. 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 Closest relative - a son in [place]. No futile or heroic measures but not decided about the details of CPR. Would not want long-term mechanical ventilation.      Constitutional: Denies fever, malaise, fatigue, headache or abrupt weight changes.  Musculoskeletal: Pt reports hand pain. Denies decrease in range of motion, difficulty with gait, muscle pain or joint pain and swelling.  Skin: Denies redness, rashes, lesions or ulcercations.  Neurological: Pt reports numbness or tingling in the hands. Denies dizziness, difficulty with memory, difficulty with speech or problems with balance and coordination.   No other specific complaints in a complete review of systems (except as listed in HPI above).  Objective:   Physical Exam    BP 152/88  Pulse 66  Temp 97.7 F (36.5 C) (Oral)  Ht 5\' 2"  (1.575 m)  Wt 131 lb (59.421 kg)  BMI 23.96 kg/m2  SpO2 99% Wt Readings from Last 3 Encounters:  03/17/12 131 lb (59.421 kg)  02/11/12 130 lb 8 oz (59.194 kg)  08/11/11 132 lb 12.8 oz (60.238 kg)    General: Appears her stated age, well developed, well nourished in NAD. Cardiovascular: Normal rate and rhythm. S1,S2 noted.  No murmur, rubs or gallops noted. No JVD or BLE edema. No carotid bruits noted. Pulmonary/Chest: Normal effort and positive vesicular breath sounds. No respiratory distress. No wheezes, rales or ronchi noted.  Musculoskeletal: Decreased range of motion of the  fourth and fifth fingers of the right hand due to tendon contractures.. No signs of joint swelling. No difficulty with gait.  Neurological: Alert and oriented. Cranial nerves II-XII intact. Coordination normal. +DTRs bilaterally. Strength 5/5 bilaterally.      Assessment & Plan:   Hand pain, new onset with additional workup required:  eRx given for prednisone x 7 days Referral placed for hand surgeon for release of tenosynovitis per patient request.  RTC as needed or if symptoms persist.

## 2012-03-17 NOTE — Patient Instructions (Addendum)
De Quervain's Tenosynovitis De Quervain's tenosynovitis involves inflammation of one or two tendon linings (sheaths) or strain of one or two tendons to the thumb: extensor pollicis brevis (EPB), or abductor pollicis longus (APL). This causes pain on the side of the wrist and base of the thumb. Tendon sheaths secrete a fluid that lubricates the tendon, allowing the tendon to move smoothly. When the sheath becomes inflamed, the tendon cannot move freely in the sheath. Both the EPB and APL tendons are important for proper use of the hand. The EPB tendon is important for straightening the thumb. The APL tendon is important for moving the thumb away from the index finger (abducting). The two tendons pass through a small tube (canal) in the wrist, near the base of the thumb. When the tendons become inflamed, pain is usually felt in this area. SYMPTOMS   Pain, tenderness, swelling, warmth, or redness over the base of the thumb and thumb side of the wrist.  Pain that gets worse when straightening the thumb.  Pain that gets worse when moving the thumb away from the index finger, against resistance.  Pain with pinching or gripping.  Locking or catching of the thumb.  Limited motion of the thumb.  Crackling sound (crepitation) when the tendon or thumb is moved or touched.  Fluid-filled cyst in the area of the base of the thumb. CAUSES   Tenosynovitis is often linked with overuse of the wrist.  Tenosynovitis may be caused by repeated injury to the thumb muscle and tendon units, and with repeated motions of the hand and wrist, due to friction of the tendon within the lining (sheath).  Tenosynovitis may also be due to a sudden increase in activity or change in activity. RISK INCREASES WITH:  Sports that involve repeated hand and wrist motions (golf, bowling, tennis, squash, racquetball).  Heavy labor.  Poor physical wrist strength and flexibility.  Failure to warm up properly before practice or  play.  Female gender.  New mothers who hold their baby's head for long periods or lift infants with thumbs in the infant's armpit (axilla). PREVENTION  Warm up and stretch properly before practice or competition.  Allow enough time for rest and recovery between practices and competition.  Maintain appropriate conditioning:  Cardiovascular fitness.  Forearm, wrist, and hand flexibility.  Muscle strength and endurance.  Use proper exercise technique. PROGNOSIS  This condition is usually curable within 6 weeks, if treated properly with non-surgical treatment and resting of the affected area.  RELATED COMPLICATIONS   Longer healing time if not properly treated or if not given enough time to heal.  Chronic inflammation, causing recurring symptoms of tenosynovitis. Permanent pain or restriction of movement.  Risks of surgery: infection, bleeding, injury to nerves (numbness of the thumb), continued pain, incomplete release of the tendon sheath, recurring symptoms, cutting of the tendons, tendons sliding out of position, weakness of the thumb, thumb stiffness. TREATMENT  First, treatment involves the use of medicine and ice, to reduce pain and inflammation. Patients are encouraged to stop or modify activities that aggravate the injury. Stretching and strengthening exercises may be advised. Exercises may be completed at home or with a therapist. You may be fitted with a brace or splint, to limit motion and allow the injury to heal. Your caregiver may also choose to give you a corticosteroid injection, to reduce the pain and inflammation. If non-surgical treatment is not successful, surgery may be needed. Most tenosynovitis surgeries are done as outpatient procedures (you go home the   same day). Surgery may involve local, regional (whole arm), or general anesthesia.  MEDICATION   If pain medicine is needed, nonsteroidal anti-inflammatory medicines (aspirin and ibuprofen), or other minor pain  relievers (acetaminophen), are often advised.  Do not take pain medicine for 7 days before surgery.  Prescription pain relievers are often prescribed only after surgery. Use only as directed and only as much as you need.  Corticosteroid injections may be given if your caregiver thinks they are needed. There is a limited number of times these injections may be given. COLD THERAPY   Cold treatment (icing) should be applied for 10 to 15 minutes every 2 to 3 hours for inflammation and pain, and immediately after activity that aggravates your symptoms. Use ice packs or an ice massage. SEEK MEDICAL CARE IF:   Symptoms get worse or do not improve in 2 to 4 weeks, despite treatment.  You experience pain, numbness, or coldness in the hand.  Blue, gray, or dark color appears in the fingernails.  Any of the following occur after surgery: increased pain, swelling, redness, drainage of fluids, bleeding in the affected area, or signs of infection.  New, unexplained symptoms develop. (Drugs used in treatment may produce side effects.) Document Released: 03/29/2005 Document Revised: 06/21/2011 Document Reviewed: 07/11/2008 [employer] Patient Information 2013 [employer].  

## 2012-05-18 ENCOUNTER — Ambulatory Visit (INDEPENDENT_AMBULATORY_CARE_PROVIDER_SITE_OTHER): Payer: Federal, State, Local not specified - PPO | Admitting: Internal Medicine

## 2012-05-18 ENCOUNTER — Encounter: Payer: Self-pay | Admitting: Internal Medicine

## 2012-05-18 VITALS — BP 122/84 | HR 73 | Temp 98.8°F | Resp 10 | Wt 131.0 lb

## 2012-05-18 DIAGNOSIS — I10 Essential (primary) hypertension: Secondary | ICD-10-CM

## 2012-05-20 NOTE — Progress Notes (Signed)
  Subjective:    Patient ID: Samantha Norton, female    DOB: October 15, 1931, 79 y.o.   MRN: [id number]  HPI Patient left w/o being seen   Review of Systems     Objective:   Physical Exam        Assessment & Plan:

## 2012-05-24 ENCOUNTER — Ambulatory Visit (INDEPENDENT_AMBULATORY_CARE_PROVIDER_SITE_OTHER)
Admission: RE | Admit: 2012-05-24 | Discharge: 2012-05-24 | Disposition: A | Discharge status: Home / Self Care | Payer: Federal, State, Local not specified - PPO | Source: Ambulatory Visit | Attending: Internal Medicine | Admitting: Internal Medicine

## 2012-05-24 ENCOUNTER — Encounter: Payer: Self-pay | Admitting: Internal Medicine

## 2012-05-24 ENCOUNTER — Ambulatory Visit (INDEPENDENT_AMBULATORY_CARE_PROVIDER_SITE_OTHER): Payer: Federal, State, Local not specified - PPO | Admitting: Internal Medicine

## 2012-05-24 VITALS — BP 120/80 | HR 80 | Temp 97.7°F | Resp 16 | Wt 132.0 lb

## 2012-05-24 DIAGNOSIS — R209 Unspecified disturbances of skin sensation: Secondary | ICD-10-CM

## 2012-05-24 DIAGNOSIS — R202 Paresthesia of skin: Secondary | ICD-10-CM

## 2012-05-24 DIAGNOSIS — G4700 Insomnia, unspecified: Secondary | ICD-10-CM

## 2012-05-24 DIAGNOSIS — I10 Essential (primary) hypertension: Secondary | ICD-10-CM

## 2012-05-24 MED ORDER — IRBESARTAN-HYDROCHLOROTHIAZIDE 300-12.5 MG PO TABS: 1.0000 | ORAL_TABLET | Freq: Every day | ORAL | Status: DC

## 2012-05-24 NOTE — Patient Instructions (Addendum)
1. Sleep duration insomnia -  Plan -  Continue good sleep hygiene  Benadryl (diphenhydramine) is good for sleep and as safe as any prescription drug. For ache/pain the combination with ibuprofen (Motrin PM) is a good product  2. Paresthesia (pins and needles ) in both hands - there does not seem to be tenosynovitis or carpal tunnel. I am more concerned about possible nerve compression of an intermittent nature at the cervical spine - C5-6, C6-7. Plan  Will obtain cervical spine x-rays to get an idea of whether there is any disk disease or Arthritic bone changes  3. Dupytren's contractures - if you are having any limitation in activity due to the contracture(s) you will need to see a hand surgeon.

## 2012-05-24 NOTE — Progress Notes (Signed)
Subjective:    Patient ID: Samantha Norton, female    DOB: 1931/05/17, 79 y.o.   MRN: [id number]  HPI Mrs. Wardrop is having trouble sleeping - duration insomnia. This was discussed with her in October '12. She can do well with 5 hrs of sleep but this only happens once or twice a week. She has tried Motrin PM which did help - she did not feel bad the next day.   She was seen Dec 6th '13 byt Ms. Baity for hand pain and paresthesia dx'd as tenosynovitis for which she was given prednisone which did help. She never did get an appointment with a hand surgeon. She reports that both hands are numb, she has weakness in her grip and she will drop items. Her handwriting is also affected.   Her Blood pressure has been OK - she checks it at home. She is doing well with Avalide.  Past Medical History  Diagnosis Date  . Hypertension   . Diabetes mellitus   . Hyperlipidemia   . Varicella     in high school  . Anemia     thrombocytopenia, seen by hematology.  . Diverticulosis of colon   . GERD (gastroesophageal reflux disease)     no medications. Last EGD 2+ years ago. Had H. Pylori  . Osteoporosis     by patient report - osteopenia   Past Surgical History  Procedure Laterality Date  . Breast lumpectomy      bilateral biospsies over time x 3-4 , always beng  . Tonsilectomy    . Abdominal hysterectomy      partial, due to fibroid tumors.   Family History  Problem Relation Age of Onset  . Hypertension Mother   . Stroke Mother   . Diabetes Mother   . Stroke Father   . Hypertension Father   . Arthritis Brother     hip problems, in w/c  . Cancer Daughter     breast   History   Social History  . Marital Status: Divorced    Spouse Name: N/A    Number of Children: 7  . Years of Education: 13   Occupational History  . Postal clerk     retired at 39 years   Social History Main Topics  . Smoking status: Former Smoker    Quit date: 02/08/1972  . Smokeless tobacco: Never Used  . Alcohol  Use: Yes     Comment: rare glass of wine  . Drug Use: No  . Sexually Active: Not Currently   Other Topics Concern  . Not on file   Social History Narrative   HSG, 1 year [employer]. Married '52 - 22 yrs/divorced. 4 drtrs  '52, '54, '58, '60; 3 sons - '59, '62, '66; grandchildren 16; a lot of great-grands 15. Native of [place]. 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    LIves alone. Closest relative - a son in [place]. No futile or heroic measures but not decided about the details of CPR. Would not want long-term mechanical ventilation.     Current Outpatient Prescriptions on File Prior to Visit  Medication Sig Dispense Refill  . aspirin 81 MG tablet Take 81 mg by mouth daily.        . calcium-vitamin D (OSCAL WITH D) 250-125 MG-UNIT per tablet Take 1 tablet by mouth daily.        Marland Kitchen FLUVIRIN PRESERVATIVE FREE SUSP injection       . multivitamin (THERAGRAN) per tablet Take 1 tablet  by mouth daily.        Marland Kitchen omega-3 acid ethyl esters (LOVAZA) 1 G capsule Take 1 g by mouth daily.        Marland Kitchen PATADAY 0.2 % SOLN       . predniSONE (DELTASONE) 20 MG tablet Take 1 tablet (20 mg total) by mouth daily.  7 tablet  0   No current facility-administered medications on file prior to visit.     Review of Systems System review is negative for any constitutional, cardiac, pulmonary, GI or neuro symptoms or complaints other than as described in the HPI.     Objective:   Physical Exam Filed Vitals:   05/24/12 1315  BP: 120/80  Pulse: 80  Temp: 97.7 F (36.5 C)  Resp: 16   Wt Readings from Last 3 Encounters:  05/24/12 132 lb (59.875 kg)  05/18/12 131 lb 0.6 oz (59.439 kg)  03/17/12 131 lb (59.421 kg)   gen'l - WNWD AA woman in no distress Neck - full active ROM Cor - RRR Pulm - normal respirations MSK - dupytren's contracture right 4th digit; crooked 5th digit right hand - tendon issue; minimal contracture left palm; normal Finklestein's test, negative Tinel's and PHalen's maneuver. Normal  grip strength. Neuro - A&O x 3, CN II-XII normal, normal DTR biceps and radial tendon. Normal sensation to light touch and deep vibration       Assessment & Plan:

## 2012-05-25 DIAGNOSIS — R202 Paresthesia of skin: Secondary | ICD-10-CM | POA: Insufficient documentation

## 2012-05-25 NOTE — Assessment & Plan Note (Signed)
Paresthesia (pins and needles ) in both hands - there does not seem to be tenosynovitis or carpal tunnel. I am more concerned about possible nerve compression of an intermittent nature at the cervical spine - C5-6, C6-7. Plan  Will obtain cervical spine x-rays to get an idea of whether there is any disk disease or Arthritic bone changes  Addendum: c-spine with multi-level DDD including C4-5,C5-6, C6-7 which may be the cause of paresthesia. For worsening discomfort or persistent discomfort despite NSAIDs will get MRI c-spine

## 2012-05-25 NOTE — Assessment & Plan Note (Signed)
Sleep duration insomnia -  Plan -  Continue good sleep hygiene  Benadryl (diphenhydramine) is good for sleep and as safe as any prescription drug. For ache/pain the combination with ibuprofen (Motrin PM) is a good product

## 2012-05-25 NOTE — Assessment & Plan Note (Signed)
BP Readings from Last 3 Encounters:  05/24/12 120/80  05/18/12 122/84  03/17/12 152/88   Excellent control on present medications.

## 2012-08-10 ENCOUNTER — Ambulatory Visit (HOSPITAL_BASED_OUTPATIENT_CLINIC_OR_DEPARTMENT_OTHER): Payer: Federal, State, Local not specified - PPO | Admitting: Oncology

## 2012-08-10 ENCOUNTER — Telehealth: Payer: Self-pay | Admitting: Oncology

## 2012-08-10 ENCOUNTER — Other Ambulatory Visit (HOSPITAL_BASED_OUTPATIENT_CLINIC_OR_DEPARTMENT_OTHER): Payer: Federal, State, Local not specified - PPO | Admitting: Lab

## 2012-08-10 VITALS — BP 187/92 | HR 62 | Temp 97.2°F | Resp 18 | Ht 62.0 in | Wt 129.3 lb

## 2012-08-10 DIAGNOSIS — D649 Anemia, unspecified: Secondary | ICD-10-CM

## 2012-08-10 LAB — CBC WITH DIFFERENTIAL/PLATELET
BASO%: 1.7 % (ref 0.0–2.0)
EOS%: 4 % (ref 0.0–7.0)
MCH: 28.7 pg (ref 25.1–34.0)
MCHC: 32.7 g/dL (ref 31.5–36.0)
MONO#: 0.2 10*3/uL (ref 0.1–0.9)
RBC: 4.22 10*6/uL (ref 3.70–5.45)
RDW: 14.3 % (ref 11.2–14.5)
WBC: 3.8 10*3/uL — ABNORMAL LOW (ref 3.9–10.3)
lymph#: 1.5 10*3/uL (ref 0.9–3.3)

## 2012-08-10 LAB — CHCC SMEAR

## 2012-08-10 NOTE — Progress Notes (Signed)
Hematology and Oncology Follow Up Visit  Samantha Norton [id number] [birth year] 79 y.o. 08/10/2012 1:36 PM     Principle Diagnosis: This is a 79 year old female initially diagnosed with normocytic normochromic anemia and mild thrombocytopenia diagnosed in 2010.  Current therapy: Observation and surveillance.  Her hemoglobins have normalized.  Interim History:  Samantha Norton presents today for a followup visit.  This is a pleasant 79 year old female who we saw initially back in December 2010 for evaluation of her  anemia and thrombocytopenia.  Her hemoglobins have been as high as 12.9 since that time and did not really require any intervention.  She does have mild thrombocytopenia as well.  Platelet count ranged between 100-120,000.  Since the last time I saw her, she had not reported any major changes in her performance status, had not had any appetite changes, had not had any constitutional symptoms.  Activity level remains stable at this point.  He did have one episode of nose bleed on one occasion but has resolved and was likely due to dry nasal membranes.   Medications: I have reviewed the patient's current medications. Current outpatient prescriptions:aspirin 81 MG tablet, Take 81 mg by mouth daily.  , Disp: , Rfl: ;  calcium-vitamin D (OSCAL WITH D) 250-125 MG-UNIT per tablet, Take 1 tablet by mouth daily.  , Disp: , Rfl: ;  FLUVIRIN PRESERVATIVE FREE SUSP injection, , Disp: , Rfl: ;  irbesartan-hydrochlorothiazide (AVALIDE) 300-12.5 MG per tablet, Take 1 tablet by mouth daily., Disp: 90 tablet, Rfl: 3 multivitamin (THERAGRAN) per tablet, Take 1 tablet by mouth daily.  , Disp: , Rfl: ;  omega-3 acid ethyl esters (LOVAZA) 1 G capsule, Take 1 g by mouth daily.  , Disp: , Rfl: ;  PATADAY 0.2 % SOLN, , Disp: , Rfl: ;  predniSONE (DELTASONE) 20 MG tablet, Take 1 tablet (20 mg total) by mouth daily., Disp: 7 tablet, Rfl: 0  Allergies: No Known Allergies  Past Medical History, Surgical history, Social history,  and Family History were reviewed and updated.  Review of Systems: Constitutional:  Negative for fever, chills, night sweats, anorexia, weight loss, pain. Cardiovascular: no chest pain or dyspnea on exertion Respiratory: negative Neurological: negative Dermatological: negative ENT: negative Skin: Negative. Gastrointestinal: negative Genito-Urinary: negative Hematological and Lymphatic: negative Breast: negative Musculoskeletal: negative Remaining ROS negative. Physical Exam: Blood pressure 187/92, pulse 62, temperature 97.2 F (36.2 C), temperature source Oral, resp. rate 18, height 5\' 2"  (1.575 m), weight 129 lb 4.8 oz (58.65 kg). ECOG: 1 General appearance: alert Head: Normocephalic, without obvious abnormality, atraumatic Neck: no adenopathy, no carotid bruit, no JVD, supple, symmetrical, trachea midline and thyroid not enlarged, symmetric, no tenderness/mass/nodules Lymph nodes: Cervical, supraclavicular, and axillary nodes normal. Heart:regular rate and rhythm, S1, S2 normal, no murmur, click, rub or gallop Lung:chest clear, no wheezing, rales, normal symmetric air entry. Abdomin: soft, non-tender, without masses or organomegaly EXT:no erythema, induration, or nodules Skin: no rash, no petechia    Lab Results: Lab Results  Component Value Date   WBC 3.8* 08/10/2012   HGB 12.1 08/10/2012   HCT 37.0 08/10/2012   MCV 87.7 08/10/2012   PLT 127* 08/10/2012     Chemistry      Component Value Date/Time   NA 141 02/11/2012 1441   NA 141 08/11/2011 1458   K 4.1 02/11/2012 1441   K 3.6 08/11/2011 1458   CL 107 02/11/2012 1441   CL 103 08/11/2011 1458   CO2 29 02/11/2012 1441   CO2 30 08/11/2011 1458  BUN 20.0 02/11/2012 1441   BUN 20 08/11/2011 1458   CREATININE 0.9 02/11/2012 1441   CREATININE 0.95 08/11/2011 1458      Component Value Date/Time   CALCIUM 10.1 02/11/2012 1441   CALCIUM 9.7 08/11/2011 1458   ALKPHOS 67 02/11/2012 1441   ALKPHOS 59 08/11/2011 1458   AST 38* 02/11/2012 1441   AST  30 08/11/2011 1458   ALT 40 02/11/2012 1441   ALT 24 08/11/2011 1458   BILITOT 0.82 02/11/2012 1441   BILITOT 0.5 08/11/2011 1458       Impression and Plan:  This is a 79 year old female with the following issues. 1. Anemia. Normochromic normocytic in the past, but have been really normal in the last 4 years.  I do not see any evidence of any vitamin deficiency.  Really no further workup in management from an anemia standpoint at this time. 2. Mild thrombocytopenia.  Platelet counts have been above 100,000.  Differential diagnosis include medication versus autoimmune phenomenon such as idiopathic thrombocytopenic purpura.  I doubt that this is an early sign of myelodysplasia, but will continue to monitor it.  Repeat blood counts in about 12 months.   Samantha Hose, MD 5/1/20141:36 PM

## 2012-11-15 ENCOUNTER — Other Ambulatory Visit: Payer: Self-pay

## 2012-12-25 ENCOUNTER — Encounter: Payer: Self-pay | Admitting: Family Medicine

## 2012-12-25 ENCOUNTER — Ambulatory Visit (INDEPENDENT_AMBULATORY_CARE_PROVIDER_SITE_OTHER): Payer: Federal, State, Local not specified - PPO | Admitting: Family Medicine

## 2012-12-25 VITALS — BP 196/110 | HR 81 | Temp 98.3°F | Ht 62.0 in | Wt 130.0 lb

## 2012-12-25 DIAGNOSIS — R040 Epistaxis: Secondary | ICD-10-CM | POA: Insufficient documentation

## 2012-12-25 DIAGNOSIS — I10 Essential (primary) hypertension: Secondary | ICD-10-CM

## 2012-12-25 MED ORDER — IRBESARTAN-HYDROCHLOROTHIAZIDE 150-12.5 MG PO TABS: 2.0000 | ORAL_TABLET | Freq: Every day | ORAL | Status: DC

## 2012-12-25 NOTE — Assessment & Plan Note (Addendum)
Likely anterior since there was resolution with pressure and oxymetazoline but since this has been recurrent and more prolonged, would like for her to be seen by ENT for evaluation.  In the meantime: - keep packing for 24hrs.  - stop aspirin - if new nose bleed: apply pressure to nasal bridge. If bleeding persists, apply afrin. If bleeding persists, seek medical care - will check CBC when patient comes back for labwork.

## 2012-12-25 NOTE — Patient Instructions (Addendum)
For your blood pressure, I would like to increase the hydrochlorothiazide in your blood pressure medicine to 25mg .  I am sending a new prescription to the pharmacy.   I would like to get some labwork done. You can make a lab appointment in the morning and come fasting: no food or drink other than water after midnight.   You can use a humidifier in your room to keep your nose nice and humid.  Also, for the next 4-5 days, stop taking aspirin.   For the nose bleed, you can buy afrin spray at the pharmacy and if you have bleeding of your nose for longer than with pressure, you can apply afrin spray to the nose.   I would recommend that you follow up with the ear nose and throat doctor.    Nosebleed Nosebleeds can be caused by many conditions including trauma, infections, polyps, foreign bodies, dry mucous membranes or climate, medications and air conditioning. Most nosebleeds occur in the front of the nose. It is because of this location that most nosebleeds can be controlled by pinching the nostrils gently and continuously. Do this for at least 10 to 20 minutes. The reason for this long continuous pressure is that you must hold it long enough for the blood to clot. If during that 10 to 20 minute time period, pressure is released, the process may have to be started again. The nosebleed may stop by itself, quit with pressure, need concentrated heating (cautery) or stop with pressure from packing. HOME CARE INSTRUCTIONS   If your nose was packed, try to maintain the pack inside until your caregiver removes it. If a gauze pack was used and it starts to fall out, gently replace or cut the end off. Do not cut if a balloon catheter was used to pack the nose. Otherwise, do not remove unless instructed.  Avoid blowing your nose for 12 hours after treatment. This could dislodge the pack or clot and start bleeding again.  If the bleeding starts again, sit up and bending forward, gently pinch the  front half of your nose continuously for 20 minutes.  If bleeding was caused by dry mucous membranes, cover the inside of your nose every morning with a petroleum or antibiotic ointment. Use your little fingertip as an applicator. Do this as needed during dry weather. This will keep the mucous membranes moist and allow them to heal.  Maintain humidity in your home by using less air conditioning or using a humidifier.  Do not use aspirin or medications which make bleeding more likely. Your caregiver can give you recommendations on this.  Resume normal activities as able but try to avoid straining, lifting or bending at the waist for several days.  If the nosebleeds become recurrent and the cause is unknown, your caregiver may suggest laboratory tests. SEEK IMMEDIATE MEDICAL CARE IF:   Bleeding recurs and cannot be controlled.  There is unusual bleeding from or bruising on other parts of the body.  You have a fever.  Nosebleeds continue.  There is any worsening of the condition which originally brought you in.  You become lightheaded, feel faint, become sweaty or vomit blood. MAKE SURE YOU:   Understand these instructions.  Will watch your condition.  Will get help right away if you are not doing well or get worse. Document Released: 01/06/2005 Document Revised: 06/21/2011 Document Reviewed: 02/28/2009 [facility] Patient Information 2014 [place], [place].

## 2012-12-25 NOTE — Progress Notes (Signed)
Patient ID: Samantha Norton    DOB: 06/29/1931, 79 y.o.   MRN: [id number] --- Subjective:  Samantha Norton is a 79 y.o.female who presents as a new patient to establish care. Her current concerns are the following:  - nose bleed: currently bleeding in the office. Started bleeding when she first arrived at the office. She has a history of nose bleeds in the past for many years, intermittent and sporadic. Last year, she started having more frequent and more prolonged nose bleeds. She was seen by ENT who prescribed packing and intranasal gels. This helped until recently, 2-3 weeks ago, where she has had more frequent bleeds. She had one last night, it lasted 2 hours with pressure on the nose. She denies any dizziness, any light headedness. She does report having more bruising recently.   - hypertension: she has been taking irbesartan/hctz 300/12.5mg  qhs. She checked her BP at home recently and it was in the 150's systolic. She denies any headache, any weakness, any dizziness, any chest pain or shortness of breath.  She denies any lower extremity swelling.    ROS: see HPI Past Medical History: reviewed and updated medications and allergies. Social History: Tobacco: quit smoking 30 years ago,. Smoked a couple of cigarettes per day for 20 years prior to this.   Objective: Filed Vitals:   12/25/12 1342  BP: 196/110  Pulse: 81  Temp: 98.3 F (36.8 C)    Physical Examination:   General appearance - alert, well appearing, and in no distress, with active nose bleed.  Nose - active bleeding from left nostril Mouth - mucous membranes moist, no active bleeding from posterior pharynx Neck - supple, no significant adenopathy Chest - clear to auscultation, no wheezes or crackles.  Heart - normal rate, regular rhythm, normal S1, S2, no murmurs Abdomen - soft, nontender, nondistended, no masses or organomegaly Extremities - no pedal edema   Interval History:  Nasal bridge pressure was applied for 30 minutes with  continuous bleeding. Oxymetazoline hydrochloride 0.05% was then squeezed in nostril. Gauze was drenched with oxymetazoline and placed in nostril.  30 minutes later, patient had decreased but still present bleeding with drainage in oropharynx. Oxymetazoline packing had fallen out.  At this point, increased pressure was added. A fresh packing with oxymetazoline was placed. Patient remained laying back with packing for another . The bleeding stopped. At this point, the packing was changed with an old clot behind it. A fresh packing with oxymetazoline was added and patient was discharged home with no active bleeding.

## 2012-12-25 NOTE — Assessment & Plan Note (Addendum)
Uncontrolled today with triage BP: 198/110, manual repeat of 160/110.  Patient on irbesartan/hctz 300/12.5mg .  - will not treat acutely in office with clonidine since patient is asymptomatic and needs to drive herself back home - will increase BP med to irbesartan/hctz 300/25 - close follow up with me in 3 days to re-check BP and adjust medication accordingly. If continues to be high, will likely start amlodipine.  - patient to return for labwork for fasting lipid panel, CMP and A1C

## 2012-12-26 ENCOUNTER — Other Ambulatory Visit (INDEPENDENT_AMBULATORY_CARE_PROVIDER_SITE_OTHER): Payer: Medicare Other

## 2012-12-26 DIAGNOSIS — I10 Essential (primary) hypertension: Secondary | ICD-10-CM

## 2012-12-26 DIAGNOSIS — R040 Epistaxis: Secondary | ICD-10-CM

## 2012-12-26 DIAGNOSIS — E785 Hyperlipidemia, unspecified: Secondary | ICD-10-CM

## 2012-12-26 LAB — COMPREHENSIVE METABOLIC PANEL
AST: 25 U/L (ref 0–37)
Albumin: 4.1 g/dL (ref 3.5–5.2)
Alkaline Phosphatase: 61 U/L (ref 39–117)
BUN: 14 mg/dL (ref 6–23)
Potassium: 3.8 mEq/L (ref 3.5–5.3)

## 2012-12-26 LAB — LIPID PANEL
Cholesterol: 197 mg/dL (ref 0–200)
Total CHOL/HDL Ratio: 3.1 Ratio
VLDL: 14 mg/dL (ref 0–40)

## 2012-12-26 NOTE — Progress Notes (Signed)
CMP,CBC,FLP AND A1C DONE TODAY Samantha Norton 

## 2012-12-26 NOTE — Addendum Note (Signed)
Addended by: Jennette Bill on: 12/26/2012 09:32 AM   Modules accepted: Orders

## 2012-12-27 ENCOUNTER — Telehealth: Payer: Self-pay | Admitting: Family Medicine

## 2012-12-27 LAB — CBC
HCT: 35.4 % — ABNORMAL LOW (ref 36.0–46.0)
Hemoglobin: 12 g/dL (ref 12.0–15.0)
MCH: 27.8 pg (ref 26.0–34.0)
MCV: 81.9 fL (ref 78.0–100.0)
RBC: 4.32 MIL/uL (ref 3.87–5.11)

## 2012-12-27 MED ORDER — IRBESARTAN-HYDROCHLOROTHIAZIDE 150-12.5 MG PO TABS: 2.0000 | ORAL_TABLET | Freq: Every day | ORAL | Status: DC

## 2012-12-27 NOTE — Telephone Encounter (Signed)
Sent to [facility] pharmacy order for avalide 150.12.5 2 tablets daily, 180 tabs for 90 day supply.   Marena Chancy, PGY-3 Family Medicine Resident

## 2012-12-27 NOTE — Telephone Encounter (Signed)
Patient needs a 90 day supply for Avalide. She says she pays the same amount for the 30 day that she pays for the 90 day supple ($105).

## 2012-12-27 NOTE — Telephone Encounter (Signed)
Will fwd. To Dr.Losq to address. Thank you. Lorenda Hatchet, Renato Battles

## 2012-12-28 ENCOUNTER — Telehealth: Payer: Self-pay | Admitting: Internal Medicine

## 2012-12-28 ENCOUNTER — Ambulatory Visit (INDEPENDENT_AMBULATORY_CARE_PROVIDER_SITE_OTHER): Payer: Medicare Other | Admitting: Family Medicine

## 2012-12-28 VITALS — BP 178/100 | HR 70 | Ht 62.0 in | Wt 130.5 lb

## 2012-12-28 DIAGNOSIS — R040 Epistaxis: Secondary | ICD-10-CM

## 2012-12-28 DIAGNOSIS — I10 Essential (primary) hypertension: Secondary | ICD-10-CM

## 2012-12-28 DIAGNOSIS — G4700 Insomnia, unspecified: Secondary | ICD-10-CM

## 2012-12-28 DIAGNOSIS — R202 Paresthesia of skin: Secondary | ICD-10-CM

## 2012-12-28 MED ORDER — IRBESARTAN-HYDROCHLOROTHIAZIDE 150-12.5 MG PO TABS: 2.0000 | ORAL_TABLET | Freq: Every day | ORAL | Status: DC

## 2012-12-28 NOTE — Telephone Encounter (Signed)
Rec'd from [facility] forward 4 pages to Dr.Norins ° °

## 2012-12-28 NOTE — Patient Instructions (Addendum)
Please make an appointment with the pharmacy clinic for 24 hr blood pressure monitoring. Next week. If they are not available next week, please make a doctor's appointment for follow up on blood pressure.   Follow up with me in 1 month.   Fat and Cholesterol Control Diet Cholesterol levels in your body are determined significantly by your diet. Cholesterol levels may also be related to heart disease. The following material helps to explain this relationship and discusses what you can do to help keep your heart healthy. Not all cholesterol is bad. Low-density lipoprotein (LDL) cholesterol is the "bad" cholesterol. It may cause fatty deposits to build up inside your arteries. High-density lipoprotein (HDL) cholesterol is "good." It helps to remove the "bad" LDL cholesterol from your blood. Cholesterol is a very important risk factor for heart disease. Other risk factors are high blood pressure, smoking, stress, heredity, and weight. The heart muscle gets its supply of blood through the coronary arteries. If your LDL cholesterol is high and your HDL cholesterol is low, you are at risk for having fatty deposits build up in your coronary arteries. This leaves less room through which blood can flow. Without sufficient blood and oxygen, the heart muscle cannot function properly and you may feel chest pains (angina pectoris). When a coronary artery closes up entirely, a part of the heart muscle may die causing a heart attack (myocardial infarction). CHECKING CHOLESTEROL When your caregiver sends your blood to a lab to be examined for cholesterol, a complete lipid (fat) profile may be done. With this test, the total amount of cholesterol and levels of LDL and HDL are determined. Triglycerides are a type of fat that circulates in the blood. They can also be used to determine heart disease risk. The list below describes what the numbers should be: Test: Total Cholesterol.  Less than 200 mg/dl. Test: LDL "bad  cholesterol."  Less than 100 mg/dl.  Less than 70 mg/dl if you are at very high risk of a heart attack or sudden cardiac death. Test: HDL "good cholesterol."  Greater than 50 mg/dl for women.  Greater than 40 mg/dl for men. Test: Triglycerides.  Less than 150 mg/dl. CONTROLLING CHOLESTEROL WITH DIET Although exercise and lifestyle factors are important, your diet is key. That is because certain foods are known to raise cholesterol and others to lower it. The goal is to balance foods for their effect on cholesterol and more importantly, to replace saturated and trans fat with other types of fat, such as monounsaturated fat, polyunsaturated fat, and omega-3 fatty acids. On average, a person should consume no more than 15 to 17 g of saturated fat daily. Saturated and trans fats are considered "bad" fats, and they will raise LDL cholesterol. Saturated fats are primarily found in animal products such as meats, butter, and cream. However, that does not mean you need to give up all your favorite foods. Today, there are good tasting, low-fat, low-cholesterol substitutes for most of the things you like to eat. Choose low-fat or nonfat alternatives. Choose round or loin cuts of red meat. These types of cuts are lowest in fat and cholesterol. Chicken (without the skin), fish, veal, and ground [place] breast are great choices. Eliminate fatty meats, such as hot dogs and salami. Even shellfish have little or no saturated fat. Have a 3 oz (85 g) portion when you eat lean meat, poultry, or fish. Trans fats are also called "partially hydrogenated oils." They are oils that have been scientifically manipulated so that  they are solid at room temperature resulting in a longer shelf life and improved taste and texture of foods in which they are added. Trans fats are found in stick margarine, some tub margarines, cookies, crackers, and baked goods.  When baking and cooking, oils are a great substitute for butter. The  monounsaturated oils are especially beneficial since it is believed they lower LDL and raise HDL. The oils you should avoid entirely are saturated tropical oils, such as coconut and palm.  Remember to eat a lot from food groups that are naturally free of saturated and trans fat, including fish, fruit, vegetables, beans, grains (barley, rice, couscous, bulgur wheat), and pasta (without cream sauces).  IDENTIFYING FOODS THAT LOWER CHOLESTEROL  Soluble fiber may lower your cholesterol. This type of fiber is found in fruits such as apples, vegetables such as broccoli, potatoes, and carrots, legumes such as beans, peas, and lentils, and grains such as barley. Foods fortified with plant sterols (phytosterol) may also lower cholesterol. You should eat at least 2 g per day of these foods for a cholesterol lowering effect.  Read package labels to identify low-saturated fats, trans fat free, and low-fat foods at the supermarket. Select cheeses that have only 2 to 3 g saturated fat per ounce. Use a heart-healthy tub margarine that is free of trans fats or partially hydrogenated oil. When buying baked goods (cookies, crackers), avoid partially hydrogenated oils. Breads and muffins should be made from whole grains (whole-wheat or whole oat flour, instead of "flour" or "enriched flour"). Buy non-creamy canned soups with reduced salt and no added fats.  FOOD PREPARATION TECHNIQUES  Never deep-fry. If you must fry, either stir-fry, which uses very little fat, or use non-stick cooking sprays. When possible, broil, bake, or roast meats, and steam vegetables. Instead of putting butter or margarine on vegetables, use lemon and herbs, applesauce, and cinnamon (for squash and sweet potatoes), nonfat yogurt, salsa, and low-fat dressings for salads.  LOW-SATURATED FAT / LOW-FAT FOOD SUBSTITUTES Meats / Saturated Fat (g)  Avoid: Steak, marbled (3 oz/85 g) / 11 g  Choose: Steak, lean (3 oz/85 g) / 4 g  Avoid: Hamburger (3 oz/85  g) / 7 g  Choose: Hamburger, lean (3 oz/85 g) / 5 g  Avoid: Ham (3 oz/85 g) / 6 g  Choose: Ham, lean cut (3 oz/85 g) / 2.4 g  Avoid: Chicken, with skin, dark meat (3 oz/85 g) / 4 g  Choose: Chicken, skin removed, dark meat (3 oz/85 g) / 2 g  Avoid: Chicken, with skin, light meat (3 oz/85 g) / 2.5 g  Choose: Chicken, skin removed, light meat (3 oz/85 g) / 1 g Dairy / Saturated Fat (g)  Avoid: Whole milk (1 cup) / 5 g  Choose: Low-fat milk, 2% (1 cup) / 3 g  Choose: Low-fat milk, 1% (1 cup) / 1.5 g  Choose: Skim milk (1 cup) / 0.3 g  Avoid: Hard cheese (1 oz/28 g) / 6 g  Choose: Skim milk cheese (1 oz/28 g) / 2 to 3 g  Avoid: Cottage cheese, 4% fat (1 cup) / 6.5 g  Choose: Low-fat cottage cheese, 1% fat (1 cup) / 1.5 g  Avoid: Ice cream (1 cup) / 9 g  Choose: Sherbet (1 cup) / 2.5 g  Choose: Nonfat frozen yogurt (1 cup) / 0.3 g  Choose: Frozen fruit bar / trace  Avoid: Whipped cream (1 tbs) / 3.5 g  Choose: Nondairy whipped topping (1 tbs) / 1 g  Condiments / Saturated Fat (g)  Avoid: Mayonnaise (1 tbs) / 2 g  Choose: Low-fat mayonnaise (1 tbs) / 1 g  Avoid: Butter (1 tbs) / 7 g  Choose: Extra light margarine (1 tbs) / 1 g  Avoid: Coconut oil (1 tbs) / 11.8 g  Choose: Olive oil (1 tbs) / 1.8 g  Choose: Corn oil (1 tbs) / 1.7 g  Choose: Safflower oil (1 tbs) / 1.2 g  Choose: Sunflower oil (1 tbs) / 1.4 g  Choose: Soybean oil (1 tbs) / 2.4 g  Choose: Canola oil (1 tbs) / 1 g Document Released: 03/29/2005 Document Revised: 06/21/2011 Document Reviewed: 09/17/2010 [employer] Patient Information 2014 [place], [place].

## 2012-12-31 NOTE — Assessment & Plan Note (Signed)
Elevated due to patient not taking the new dosing of irbesartan/hctz (had changed from 300/12.5 to 300/25).  -resent Rx for new prescription - recommended that patient be seen by the pharmacy clinic for 24hr BP monitoring, especially since there seems to be a discrepancy between the home and office BP's. If unable to get appointment next week, recommended that she be seen for a doctor's visit.  - patient will likely not be at goal with new change in dosing and may benefit from amlodipine added.

## 2012-12-31 NOTE — Progress Notes (Unsigned)
Patient ID: Ena Demary    DOB: 1931-05-07, 79 y.o.   MRN: [id number] --- Subjective:  Priscella is a 79 y.o.female who presents for follow up on hypertension and epistaxis.  - epistaxis: Patient was seen in clinic for a new patient visit on 12/25/12 with an active nose bleed which required time, pressure and oxymetazoline for it to stop. It has not bled since. She was seen by ENT and they have scheduled caudery for next week.  CBC was done after her appointment and showed platelet count of 131, stable from previous (baseline: 107-127), upon review of the records and discussion with the patient, she has been monitored and followed by Dr. Clelia Croft with hematology and is being monitored with yearly CBC's.   - HTN: BP in the office was elevated at last visit. Irbesartan/hctz was increased from 300/12.5 to 300/25, but patient states that she did not fill the medicine as she wasn't sure if it had been sent to the correct pharmacy. She checks BP's at home and they range between 140's-150's. She denies any chest pain, shortness of breath, headache, weakness or lower extremity  - insomnia: patient brings up difficulty sleeping. She states that this has been ongoing for 5 years. 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  She sleeps 4 hrs a night. She goes to sleep around 11:30pm. She is able to fall asleep but has trouble staying asleep. She sleeps 2 hrs and then wakes up. She doesn't watch TV in bed, no electronics or books. She drinks a small cup of coffee before noon. She doesn't take any medicine for it. She doesn't nap. She states that when she worked a long day in her yard, she was able to have a long night's sleep.   - numbness and tingling in both hands, left more than right: has been ongoing for a few months, associated with some weakness and dropping things. She has a contracture of her right 4th finger  ROS: see HPI Past  Medical History: reviewed and updated medications and allergies. Social History: Tobacco: none  Objective: Filed Vitals:   12/28/12 1336  BP: 178/100  Pulse: 70   Repeat: 160/100 Physical Examination:   General appearance - alert, well appearing, and in no distress Chest - clear to auscultation, no wheezes, rales or rhonchi, symmetric air entry Heart - normal rate, regular rhythm, normal S1, S2, no murmurs Extremities - no pedal edema Hands - dupuytren's contracture on right 4th digit, normal grip strength bilaterally, negative Tinel's or Phalen's. Normal radial pulses bilaterally  This encounter was created in error - please disregard.

## 2012-12-31 NOTE — Assessment & Plan Note (Signed)
Encouraged patient to continue with good sleep hygiene. Discussed increasing exercise activity during the day to help with more restorative sleep at night. Encouraged writing things down on paper when she can't go back to sleep due to multiple thoughts keeping her awake.  If this doesn't work, consider switching her HCTZ to the morning to reduce the possibility of waking up at night to urinate. Also, may consider trazadone.

## 2012-12-31 NOTE — Assessment & Plan Note (Signed)
C spine showing degenrative disc disease that could be responsible for paresthesias. Negative Tinel's and phalen's. Consider empiric cock up splint to see if this has any beneficial effect.  Will monitor. If continues to worsen, may consider MRI spine.

## 2012-12-31 NOTE — Assessment & Plan Note (Signed)
Resolved after last episode in the office. Patient evaluated by ENT who will see her for cautery of anterior nose bleed. Patient with mild thromocytopenia in the 130's, which may contribute to longer bleeding time.  Continue to hold aspirin until after cauterization.

## 2013-01-05 ENCOUNTER — Encounter: Payer: Self-pay | Admitting: Family Medicine

## 2013-01-05 ENCOUNTER — Ambulatory Visit (INDEPENDENT_AMBULATORY_CARE_PROVIDER_SITE_OTHER): Payer: Federal, State, Local not specified - PPO | Admitting: Family Medicine

## 2013-01-05 VITALS — BP 173/97 | HR 69 | Ht 62.0 in | Wt 127.9 lb

## 2013-01-05 DIAGNOSIS — M720 Palmar fascial fibromatosis [Dupuytren]: Secondary | ICD-10-CM | POA: Insufficient documentation

## 2013-01-05 DIAGNOSIS — R202 Paresthesia of skin: Secondary | ICD-10-CM

## 2013-01-05 DIAGNOSIS — R209 Unspecified disturbances of skin sensation: Secondary | ICD-10-CM

## 2013-01-05 DIAGNOSIS — I10 Essential (primary) hypertension: Secondary | ICD-10-CM

## 2013-01-05 MED ORDER — GABAPENTIN 300 MG PO CAPS: 300.0000 mg | ORAL_CAPSULE | Freq: Three times a day (TID) | ORAL | Status: DC

## 2013-01-05 NOTE — Assessment & Plan Note (Signed)
Patient continues to have paresthesia of bilateral hands. Her symptoms are suspected to be secondary to cervical spine arthritis. No red flags identified today. Will begin trial of gabapentin to see if we can have relief of her symptoms. If no improvement is noted may consider MRI imaging.

## 2013-01-05 NOTE — Progress Notes (Signed)
  Subjective:    Patient ID: Samantha Norton, female    DOB: November 22, 1931, 79 y.o.   MRN: [id number]  HPI 79 year old female presents for followup of multiple medical conditions.  Hypertension-patient was recently restarted on her home Avalide which is only been taking for the past 2 days, she denies side effects of medications, denies lightheadedness, denies chest pain, denies vision changes  Bilateral hand paresthesia-left is greater than right, has been present for approximately one year, she was recently seen and evaluated in the office and hand splints were discussed with the patient for questionable carpal tunnel syndrome, she does have a history of cervical spine arthritis, she states that her paresthesias include all 5 fingers of bilateral hands, there is mild radiation up the left arm, denies neck pain, has occasional neck stiffness, no upper arm involvement, denies weakness, denies loss of sensation, she does report occasional dropping objects, this occurs mostly in her right hand which is affected by Dupuytren's contracture, this only occurs once or twice monthly  Dupuytren's contracture-present in the right fourth digit and the left third digit, patient feels this is contributing to her occasionally dropping objects, she's never seen a hand specialist, she is interested in a referral today  Social-formal smoker  Review of Systems  Constitutional: Negative for fever, chills and fatigue.  HENT: Positive for neck stiffness. Negative for neck pain.   Respiratory: Negative for chest tightness.   Cardiovascular: Negative for chest pain.       Objective:   Physical Exam General: Vitals reviewed, blood pressure elevated General: Pleasant African American female, no acute distress HEENT: Pupils are equal round and reactive to light, extraocular movements are intact, moist mucous membranes, uvula midline, neck was supple Cardiac: Regular in rhythm, S1 and S2 present, no murmurs, no heaves or  thrills Respiratory: Clear to patient bilaterally, normal effort MSK: No neck tenderness noted, patient had full range of motion of the cervical spine, Dupuytren's contracture of the right fourth digit and the left third digit Neuro: Strength was 5 out of 5 to shoulder abduction/aduction, out of 5 to arm flexion and extension, strength was 5 out of 5, Tinel's negative, Phalen's negative, biceps reflex on the right side was 3+, biceps reflex on the left was 2+, bilateral brachial radialis reflexes were 2+  Imaging-reviewed cervical spine x-rays performed in February of 2014 and are suggestive of cervical spine osteoarthritis     Assessment & Plan:  Please see problem specific assessment and plan.

## 2013-01-05 NOTE — Patient Instructions (Signed)
Blood Pressure - your blood pressure remains elevated today, continue to take current blood pressure medications, check blood pressure at home 3-4 times per week and bring to next appointment, make appointment with Dr. Gwenlyn Saran in 2 weeks to follow up on blood pressure  Hand Pain - I suspect that your hand numbness is related to the osteoarthritis in your spine, we will start a trial of Gabapentin, start with 300 mg at night for 3 days, then increase to 300 mg twice daily for 3 days, then increase to 300 mg three time a day  Dupuytrens contracture - you will be referred to a hand surgeon  Follow up with Dr. Gwenlyn Saran in 2 weeks.

## 2013-01-05 NOTE — Assessment & Plan Note (Signed)
Uncontrolled today and is likely due to patient only restarting her blood pressure medications in the past 2 days. Patient was counseled to continue to take blood pressure medications as prescribed. Patient to return to office in 2 weeks for blood pressure check with PCP. She was also counseled to take home blood pressures and to bring this with her for her next visit.

## 2013-01-05 NOTE — Assessment & Plan Note (Signed)
Patient would like referral to hand surgeon. Referral made.

## 2013-01-22 ENCOUNTER — Ambulatory Visit (INDEPENDENT_AMBULATORY_CARE_PROVIDER_SITE_OTHER): Payer: Federal, State, Local not specified - PPO | Admitting: Family Medicine

## 2013-01-22 ENCOUNTER — Encounter: Payer: Self-pay | Admitting: Family Medicine

## 2013-01-22 VITALS — BP 168/86 | HR 60 | Temp 98.5°F | Ht 62.0 in | Wt 131.0 lb

## 2013-01-22 DIAGNOSIS — R209 Unspecified disturbances of skin sensation: Secondary | ICD-10-CM

## 2013-01-22 DIAGNOSIS — R202 Paresthesia of skin: Secondary | ICD-10-CM

## 2013-01-22 DIAGNOSIS — I10 Essential (primary) hypertension: Secondary | ICD-10-CM

## 2013-01-22 DIAGNOSIS — Z23 Encounter for immunization: Secondary | ICD-10-CM

## 2013-01-22 NOTE — Patient Instructions (Signed)
Please make appointment for the 24hr blood pressure monitoring with the pharmacy clinic.  Follow up with me in 1-2 months.

## 2013-01-23 LAB — BASIC METABOLIC PANEL
BUN: 19 mg/dL (ref 6–23)
CO2: 30 mEq/L (ref 19–32)
Chloride: 104 mEq/L (ref 96–112)
Creat: 1.01 mg/dL (ref 0.50–1.10)
Potassium: 3.6 mEq/L (ref 3.5–5.3)
Sodium: 141 mEq/L (ref 135–145)

## 2013-01-24 NOTE — Progress Notes (Signed)
Patient ID: [name]    DOB: May 12, 1931, 79 y.o.   MRN: [id number] --- Subjective:  Samantha Norton is a 79 y.o.female who presents for follow up on hypertension and hand tingling and numbness.  - hypertension: has been taking irbesartan/hctz 150/12.5 two tablets daily for about 2 weeks. She checks her BP at home and it runs between 160's to 130's systolic. She denies any chest pain, shortness of breath, lower extremity swelling, headache or weakness.   - tingling in hands: all fingers. She has been taking gabapentin 300mg  and has been taking 2 tablets at night. This helps her sleep and also helps with the numbness and tingling but not completely. She did not tolerate increase in dose due to excessive sleepiness and she has not tried taking it during the day. She would like to know what is causing this. She denies any neck pain.    ROS: see HPI Past Medical History: reviewed and updated medications and allergies. Social History: Tobacco: none  Objective: Filed Vitals:   01/22/13 1426  BP: 168/86  Pulse: 60  Temp: 98.5 F (36.9 C)    Physical Examination:   General appearance - alert, well appearing, and in no distress Chest - clear to auscultation, no wheezes, rales or rhonchi, symmetric air entry Heart - normal rate, regular rhythm, normal S1, S2, no murmurs Extremities - no edema MSK - 5/5 grip strength bilaterally with 5/5 strength in upper extremities. No tenderness to palpation along cervical spine.     Imaging: DG cervical spine 05/24/12 Findings:  Prevertebral soft tissues normal thickness.  Osseous demineralization.  Scattered disc space narrowing and endplate spur formation at C2-  C3, C4-C5, C5-C6 and C6-C7.  Straightening of cervical lordosis question muscle spasm.  Minimal multilevel facet degenerative changes.  Minimal narrowing of the right C5-C6 neural foramen by combination  of facet and uncovertebral hypertrophy.  Remaining foramina appear grossly patent.  No acute  fracture, subluxation or bone destruction.  Minimal atherosclerotic calcification aortic arch.  C1-C2 alignment normal.  IMPRESSION:  Degenerative disc disease changes of the cervical spine as above.

## 2013-01-24 NOTE — Assessment & Plan Note (Signed)
Reviewed C spine film with patient to show evidence of DJD that is likely cause of paresthesias.  Continue gabapentin and increase as tolerated.

## 2013-01-24 NOTE — Assessment & Plan Note (Signed)
Still not at goal when BP taken in clinic. Given report of fluctuating BP's at home, would like for her to be evaluated with 24hr monitoring. If continues to be elevated, she may benefit from 3rd agent.  - get BMP to monitor Cr and K since increase in irbesartan/hctz was done 2 weeks ago

## 2013-01-26 ENCOUNTER — Encounter: Payer: Self-pay | Admitting: Family Medicine

## 2013-02-27 ENCOUNTER — Encounter: Payer: Self-pay | Admitting: Pharmacist

## 2013-02-27 ENCOUNTER — Ambulatory Visit (INDEPENDENT_AMBULATORY_CARE_PROVIDER_SITE_OTHER): Payer: Medicare Other | Admitting: Pharmacist

## 2013-02-27 ENCOUNTER — Other Ambulatory Visit: Payer: Self-pay | Admitting: Orthopedic Surgery

## 2013-02-27 VITALS — BP 170/78 | Ht 62.0 in | Wt 133.0 lb

## 2013-02-27 DIAGNOSIS — I10 Essential (primary) hypertension: Secondary | ICD-10-CM

## 2013-02-27 NOTE — Progress Notes (Signed)
S:    Patient arrives in good spirits but somewhat apprehensive about the appointment. She presents to the clinic for ambulatory blood pressure evaluation.  Diagnosed with hypertension over 10 years ago.    She reports compliance with medication. She has been taking her blood pressure at home and has readings that vary between 130-150 over ~80.   Discussed procedure for wearing the monitor and gave patient written instructions. Monitor was placed on non-dominant arm with instructions to return at 3:00 PM.   Current BP Medications include:  Avalide (irbesartan-hydrochlorothiazide 150mg /12.5mg ) 2 tablets daily in the evening.   Patient returned to the clinic and reported no problems with the device. She reported doing her normal daily routine while wearing the device with no significantly stressful events.   O:  Last 3 Office BP readings: 173/97 mmHg 168/86 mmHg 178/100 mmHg  Today's Office BP reading: 170/78 mmHg (manual reading) then 185/112 when monitor was placed  ABPM Study Data: Arm Placement left arm   Overall Mean 24hr BP:   150/87 mmHg HR: 67   Daytime Mean BP:  158/92 mmHg HR: 69   Nighttime Mean BP:  130/75 mmHg HR: 61   Dipping Pattern: yes  Sys:17.8%   Dia: 18.4%   [normal dipping ~10-20%]   Non-hypertensive ABPM thresholds: daytime BP <135/85 mmHg, sleeptime BP <120/70 mmHg NICE Hypertension Guidelines ([place]) using ABPM: Stage I: >135/85 mmHg, Stage 2: >150/95 mmHg)   BMET    Component Value Date/Time   NA 141 01/22/2013 1531   NA 141 02/11/2012 1441   K 3.6 01/22/2013 1531   K 4.1 02/11/2012 1441   CL 104 01/22/2013 1531   CL 107 02/11/2012 1441   CO2 30 01/22/2013 1531   CO2 29 02/11/2012 1441   GLUCOSE 85 01/22/2013 1531   GLUCOSE 165* 02/11/2012 1441   BUN 19 01/22/2013 1531   BUN 20.0 02/11/2012 1441   CREATININE 1.01 01/22/2013 1531   CREATININE 0.9 02/11/2012 1441   CREATININE 0.95 08/11/2011 1458   CALCIUM 10.0 01/22/2013 1531   CALCIUM 10.1 02/11/2012  1441    A/P: History of hypertension for 10 years found to have isolated systolic hypertension during the evening, dipping during the night, and nearly normal pressures in the morning and afternoon. 24-hour ambulatory blood pressure demonstrates atypical pattern and possible wearing off of medications with evening dosing.  with an average blood pressure of 150/87 mmHg, and a nocturnal dipping pattern that is normal.  Changes to medications: Begin taking 1 tablet of Avalide 150/12.5 in the morning and 1 tablet in the evening to help minimize wearing-off effect and maintain BP control throughout the day.  Results reviewed and written information provided.   F/U Clinic Visit with Dr. Gwenlyn Saran next week. Total time in face-to-face counseling 50 minutes.  Patient seen with Leafy Kindle, PharmD Candidate.

## 2013-02-27 NOTE — Patient Instructions (Addendum)
Change dosing of Avalide to 1 pill in the morning and 1 pill in the evening.   Follow up with Dr. Gwenlyn Saran next week.   NO other changes today.

## 2013-03-01 NOTE — Assessment & Plan Note (Signed)
History of hypertension for 10 years found to have isolated systolic hypertension during the evening, dipping during the night, and nearly normal pressures in the morning and afternoon. 24-hour ambulatory blood pressure demonstrates atypical pattern and possible wearing off of medications with evening dosing.  with an average blood pressure of 150/87 mmHg, and a nocturnal dipping pattern that is normal.  Changes to medications: Begin taking 1 tablet of Avalide 150/12.5 in the morning and 1 tablet in the evening to help minimize wearing-off effect and maintain BP control throughout the day.  Results reviewed and written information provided.   F/U Clinic Visit with Dr. Gwenlyn Saran next week. Total time in face-to-face counseling 50 minutes.  Patient seen with Leafy Kindle, PharmD Candidate.

## 2013-03-01 NOTE — Progress Notes (Signed)
Patient ID: Samantha Norton, female   DOB: Oct 22, 1931, 79 y.o.   MRN: [id number] Reviewed: Agree with Dr. Macky Lower documentation and management.

## 2013-03-06 ENCOUNTER — Encounter (HOSPITAL_BASED_OUTPATIENT_CLINIC_OR_DEPARTMENT_OTHER): Payer: Self-pay | Admitting: *Deleted

## 2013-03-06 NOTE — Progress Notes (Signed)
Pt goes to family practice-seeing dr tomorrow for bp ck-will come in for ekg and bmet for surgery Son to come with her

## 2013-03-07 ENCOUNTER — Encounter: Payer: Self-pay | Admitting: Family Medicine

## 2013-03-07 ENCOUNTER — Inpatient Hospital Stay (HOSPITAL_BASED_OUTPATIENT_CLINIC_OR_DEPARTMENT_OTHER): Admission: RE | Admit: 2013-03-07 | Payer: Federal, State, Local not specified - PPO | Source: Ambulatory Visit

## 2013-03-07 ENCOUNTER — Ambulatory Visit (INDEPENDENT_AMBULATORY_CARE_PROVIDER_SITE_OTHER): Payer: Federal, State, Local not specified - PPO | Admitting: Family Medicine

## 2013-03-07 ENCOUNTER — Other Ambulatory Visit: Payer: Self-pay

## 2013-03-07 ENCOUNTER — Encounter (HOSPITAL_BASED_OUTPATIENT_CLINIC_OR_DEPARTMENT_OTHER)
Admission: RE | Admit: 2013-03-07 | Discharge: 2013-03-07 | Disposition: A | Discharge status: Home / Self Care | Payer: Federal, State, Local not specified - PPO | Source: Ambulatory Visit | Attending: Orthopedic Surgery | Admitting: Orthopedic Surgery

## 2013-03-07 VITALS — BP 149/90 | HR 60 | Ht 62.0 in | Wt 131.7 lb

## 2013-03-07 DIAGNOSIS — Z01818 Encounter for other preprocedural examination: Secondary | ICD-10-CM | POA: Insufficient documentation

## 2013-03-07 DIAGNOSIS — R209 Unspecified disturbances of skin sensation: Secondary | ICD-10-CM

## 2013-03-07 DIAGNOSIS — Z01812 Encounter for preprocedural laboratory examination: Secondary | ICD-10-CM | POA: Insufficient documentation

## 2013-03-07 DIAGNOSIS — R202 Paresthesia of skin: Secondary | ICD-10-CM

## 2013-03-07 DIAGNOSIS — Z01810 Encounter for preprocedural cardiovascular examination: Secondary | ICD-10-CM | POA: Insufficient documentation

## 2013-03-07 DIAGNOSIS — M720 Palmar fascial fibromatosis [Dupuytren]: Secondary | ICD-10-CM

## 2013-03-07 DIAGNOSIS — I10 Essential (primary) hypertension: Secondary | ICD-10-CM

## 2013-03-07 LAB — BASIC METABOLIC PANEL
BUN: 17 mg/dL (ref 6–23)
Calcium: 9.9 mg/dL (ref 8.4–10.5)
Creatinine, Ser: 0.93 mg/dL (ref 0.50–1.10)
GFR calc Af Amer: 66 mL/min — ABNORMAL LOW (ref >90)
GFR calc non Af Amer: 57 mL/min — ABNORMAL LOW (ref >90)
Glucose, Bld: 92 mg/dL (ref 70–99)
Potassium: 3.8 mEq/L (ref 3.5–5.1)
Sodium: 142 mEq/L (ref 135–145)

## 2013-03-07 MED ORDER — GABAPENTIN 300 MG PO CAPS: 300.0000 mg | ORAL_CAPSULE | Freq: Every day | ORAL | Status: DC

## 2013-03-07 NOTE — Progress Notes (Signed)
Patient ID: CHELSY PARRALES    DOB: 02-04-1932, 79 y.o.   MRN: [id number] --- Subjective:  Dianah is a 79 y.o.female who presents for follow up on blood pressure.  - she was seen by Dr. Raymondo Band after 4hr BP monitor and was found to have isolated systolic hypertension in the evening with dipping at night. Her irbesartan/hctz was split to one 150/12.5 twice a day. She states that she sometimes forgets to take her morning pill since this is a new routine, but she is getting better at taking it. Number missed AM doses per week: ~2. She takes her BP at home but forgot to bring her log with her. States that the lowest SBP: 130 and highest: 160.  She denies any chest pain, shortness of breath or lower extremity swelling.   - paresthesias in hands: improved control with gabapentin. She has been taking one capsule nightly. She tried taking two but had ringing in the ears, so she went back to one. She continues to have occasional ringing of her ears.   ROS: see HPI Past Medical History: reviewed and updated medications and allergies. Social History: Tobacco: none  Objective: Filed Vitals:   03/07/13 0917  BP: 149/90  Pulse: 60    Physical Examination:   General appearance - alert, well appearing, and in nal ear canals normal Mouth - mucous membranes moist, pharynx normal without lesions Neck - supple, no significant adenopathy Chest - clear to auscultation, no wheezes, rales or rhonchi, symmetric air entry  Heart - normal rate, regular rhythm, normal S1, S2, 2/6 flow murmur best heard at right sternal border  Extremities - no pedal edema

## 2013-03-07 NOTE — Assessment & Plan Note (Signed)
Better controled on divided dose of avalide 150/12.5 bid.  Goal < 150/90 which is where she is right now.  Follow up in January. If significantly elevated, will consider adding agent.

## 2013-03-07 NOTE — Patient Instructions (Signed)
Your blood pressure is better than it was. Continue with the blood pressure medicine in the morning and at night.  Let's follow up in the new year to follow up on your blood pressure at that time.

## 2013-03-07 NOTE — Progress Notes (Signed)
EKG completed and reviewed by Dr Sampson Goon.  OK for surgery 03/14/13

## 2013-03-07 NOTE — Assessment & Plan Note (Signed)
Better controled with gabapentin.  Will refill gabapentin 300mg  qhs.

## 2013-03-07 NOTE — Assessment & Plan Note (Signed)
Scheduled for surgery next week. Has a preop appointment today where she is scheduled to get BMP and EKG.

## 2013-03-14 ENCOUNTER — Encounter (HOSPITAL_BASED_OUTPATIENT_CLINIC_OR_DEPARTMENT_OTHER)
Admission: RE | Disposition: A | Discharge status: Home / Self Care | Payer: Self-pay | Source: Ambulatory Visit | Attending: Orthopedic Surgery

## 2013-03-14 ENCOUNTER — Encounter (HOSPITAL_BASED_OUTPATIENT_CLINIC_OR_DEPARTMENT_OTHER): Payer: Self-pay | Admitting: Orthopedic Surgery

## 2013-03-14 ENCOUNTER — Ambulatory Visit (HOSPITAL_BASED_OUTPATIENT_CLINIC_OR_DEPARTMENT_OTHER)
Admission: RE | Admit: 2013-03-14 | Discharge: 2013-03-14 | Disposition: A | Discharge status: Home / Self Care | Payer: Federal, State, Local not specified - PPO | Source: Ambulatory Visit | Attending: Orthopedic Surgery | Admitting: Orthopedic Surgery

## 2013-03-14 ENCOUNTER — Encounter (HOSPITAL_BASED_OUTPATIENT_CLINIC_OR_DEPARTMENT_OTHER): Payer: Federal, State, Local not specified - PPO | Admitting: *Deleted

## 2013-03-14 ENCOUNTER — Ambulatory Visit (HOSPITAL_BASED_OUTPATIENT_CLINIC_OR_DEPARTMENT_OTHER): Payer: Federal, State, Local not specified - PPO | Admitting: *Deleted

## 2013-03-14 DIAGNOSIS — M720 Palmar fascial fibromatosis [Dupuytren]: Secondary | ICD-10-CM | POA: Insufficient documentation

## 2013-03-14 DIAGNOSIS — E785 Hyperlipidemia, unspecified: Secondary | ICD-10-CM | POA: Insufficient documentation

## 2013-03-14 DIAGNOSIS — I10 Essential (primary) hypertension: Secondary | ICD-10-CM | POA: Insufficient documentation

## 2013-03-14 HISTORY — DX: Presence of spectacles and contact lenses: Z97.3

## 2013-03-14 HISTORY — DX: Presence of dental prosthetic device (complete) (partial): Z97.2

## 2013-03-14 HISTORY — PX: FASCIOTOMY: SHX132

## 2013-03-14 LAB — GLUCOSE, CAPILLARY: Glucose-Capillary: 103 mg/dL — ABNORMAL HIGH (ref 70–99)

## 2013-03-14 SURGERY — FASCIOTOMY, UPPER EXTREMITY
Anesthesia: Monitor Anesthesia Care | Site: Finger | Laterality: Right

## 2013-03-14 MED ORDER — FENTANYL CITRATE 0.05 MG/ML IJ SOLN: 50.0000 ug | INTRAMUSCULAR | Status: DC | PRN

## 2013-03-14 MED ORDER — CHLORHEXIDINE GLUCONATE 4 % EX LIQD: 60.0000 mL | Freq: Once | CUTANEOUS | Status: DC

## 2013-03-14 MED ORDER — HYDROCODONE-ACETAMINOPHEN 5-325 MG PO TABS: 1.0000 | ORAL_TABLET | Freq: Four times a day (QID) | ORAL | Status: DC | PRN

## 2013-03-14 MED ORDER — FENTANYL CITRATE 0.05 MG/ML IJ SOLN
INTRAMUSCULAR | Status: DC | PRN
  Administered 2013-03-14 (×2): 25 ug via INTRAVENOUS

## 2013-03-14 MED ORDER — MEPERIDINE HCL 25 MG/ML IJ SOLN: 6.2500 mg | INTRAMUSCULAR | Status: DC | PRN

## 2013-03-14 MED ORDER — MIDAZOLAM HCL 2 MG/2ML IJ SOLN
INTRAMUSCULAR | Status: AC
  Filled 2013-03-14: qty 2

## 2013-03-14 MED ORDER — PROPOFOL 10 MG/ML IV BOLUS
INTRAVENOUS | Status: DC | PRN
  Administered 2013-03-14: 20 mg via INTRAVENOUS

## 2013-03-14 MED ORDER — LACTATED RINGERS IV SOLN
INTRAVENOUS | Status: DC
  Administered 2013-03-14: 12:00:00 via INTRAVENOUS

## 2013-03-14 MED ORDER — CEFAZOLIN SODIUM 1-5 GM-% IV SOLN
INTRAVENOUS | Status: AC
  Filled 2013-03-14: qty 100

## 2013-03-14 MED ORDER — MIDAZOLAM HCL 2 MG/2ML IJ SOLN: 1.0000 mg | INTRAMUSCULAR | Status: DC | PRN

## 2013-03-14 MED ORDER — HYDROMORPHONE HCL PF 1 MG/ML IJ SOLN: 0.2500 mg | INTRAMUSCULAR | Status: DC | PRN

## 2013-03-14 MED ORDER — OXYCODONE HCL 5 MG PO TABS: 5.0000 mg | ORAL_TABLET | Freq: Once | ORAL | Status: DC | PRN

## 2013-03-14 MED ORDER — LIDOCAINE HCL (CARDIAC) 20 MG/ML IV SOLN
INTRAVENOUS | Status: DC | PRN
  Administered 2013-03-14: 20 mg via INTRAVENOUS

## 2013-03-14 MED ORDER — CEFAZOLIN SODIUM-DEXTROSE 2-3 GM-% IV SOLR
2.0000 g | INTRAVENOUS | Status: AC
  Administered 2013-03-14: 2 g via INTRAVENOUS

## 2013-03-14 MED ORDER — FENTANYL CITRATE 0.05 MG/ML IJ SOLN
INTRAMUSCULAR | Status: AC
  Filled 2013-03-14: qty 4

## 2013-03-14 MED ORDER — BUPIVACAINE HCL (PF) 0.25 % IJ SOLN
INTRAMUSCULAR | Status: DC | PRN
  Administered 2013-03-14: 2 mL

## 2013-03-14 MED ORDER — ONDANSETRON HCL 4 MG/2ML IJ SOLN
INTRAMUSCULAR | Status: DC | PRN
  Administered 2013-03-14: 3 mg via INTRAVENOUS

## 2013-03-14 MED ORDER — BUPIVACAINE HCL (PF) 0.25 % IJ SOLN
INTRAMUSCULAR | Status: AC
  Filled 2013-03-14: qty 30

## 2013-03-14 MED ORDER — LIDOCAINE HCL (PF) 0.5 % IJ SOLN
INTRAMUSCULAR | Status: DC | PRN
  Administered 2013-03-14: 25 mL via INTRAVENOUS

## 2013-03-14 MED ORDER — OXYCODONE HCL 5 MG/5ML PO SOLN: 5.0000 mg | Freq: Once | ORAL | Status: DC | PRN

## 2013-03-14 MED ORDER — ONDANSETRON HCL 4 MG/2ML IJ SOLN: 4.0000 mg | Freq: Once | INTRAMUSCULAR | Status: DC | PRN

## 2013-03-14 MED ORDER — LIDOCAINE HCL (PF) 1 % IJ SOLN
INTRAMUSCULAR | Status: AC
  Filled 2013-03-14: qty 5

## 2013-03-14 MED ORDER — LIDOCAINE HCL (PF) 1 % IJ SOLN
INTRAMUSCULAR | Status: AC
  Filled 2013-03-14: qty 30

## 2013-03-14 SURGICAL SUPPLY — 44 items
BLADE MINI RND TIP GREEN BEAV (BLADE) ×2 IMPLANT
BLADE SURG 15 STRL LF DISP TIS (BLADE) ×1 IMPLANT
BLADE SURG 15 STRL SS (BLADE) ×1
BNDG COHESIVE 3X5 TAN STRL LF (GAUZE/BANDAGES/DRESSINGS) ×2 IMPLANT
BNDG ESMARK 4X9 LF (GAUZE/BANDAGES/DRESSINGS) ×2 IMPLANT
BNDG GAUZE ELAST 4 BULKY (GAUZE/BANDAGES/DRESSINGS) ×2 IMPLANT
CHLORAPREP W/TINT 26ML (MISCELLANEOUS) ×2 IMPLANT
CORDS BIPOLAR (ELECTRODE) ×2 IMPLANT
COVER MAYO STAND STRL (DRAPES) ×2 IMPLANT
COVER TABLE BACK 60X90 (DRAPES) ×2 IMPLANT
CUFF TOURNIQUET SINGLE 18IN (TOURNIQUET CUFF) ×2 IMPLANT
DECANTER SPIKE VIAL GLASS SM (MISCELLANEOUS) IMPLANT
DRAPE EXTREMITY T 121X128X90 (DRAPE) ×2 IMPLANT
DRAPE SURG 17X23 STRL (DRAPES) ×2 IMPLANT
DRSG KUZMA FLUFF (GAUZE/BANDAGES/DRESSINGS) IMPLANT
GAUZE XEROFORM 1X8 LF (GAUZE/BANDAGES/DRESSINGS) ×2 IMPLANT
GLOVE BIO SURGEON STRL SZ 6.5 (GLOVE) IMPLANT
GLOVE BIOGEL PI IND STRL 7.0 (GLOVE) ×1 IMPLANT
GLOVE BIOGEL PI INDICATOR 7.0 (GLOVE) ×1
GLOVE ECLIPSE 6.5 STRL STRAW (GLOVE) ×2 IMPLANT
GLOVE EXAM NITRILE LRG STRL (GLOVE) ×2 IMPLANT
GLOVE SURG ORTHO 8.0 STRL STRW (GLOVE) ×2 IMPLANT
GOWN BRE IMP PREV XXLGXLNG (GOWN DISPOSABLE) ×2 IMPLANT
GOWN PREVENTION PLUS XLARGE (GOWN DISPOSABLE) ×2 IMPLANT
NS IRRIG 1000ML POUR BTL (IV SOLUTION) ×2 IMPLANT
PACK BASIN DAY SURGERY FS (CUSTOM PROCEDURE TRAY) ×2 IMPLANT
PAD CAST 3X4 CTTN HI CHSV (CAST SUPPLIES) IMPLANT
PADDING CAST ABS 3INX4YD NS (CAST SUPPLIES)
PADDING CAST ABS 4INX4YD NS (CAST SUPPLIES)
PADDING CAST ABS COTTON 3X4 (CAST SUPPLIES) IMPLANT
PADDING CAST ABS COTTON 4X4 ST (CAST SUPPLIES) IMPLANT
PADDING CAST COTTON 3X4 STRL (CAST SUPPLIES)
SLEEVE SCD COMPRESS KNEE MED (MISCELLANEOUS) IMPLANT
SPLINT PLASTER CAST XFAST 3X15 (CAST SUPPLIES) IMPLANT
SPLINT PLASTER XTRA FASTSET 3X (CAST SUPPLIES)
SPONGE GAUZE 4X4 12PLY (GAUZE/BANDAGES/DRESSINGS) ×2 IMPLANT
STOCKINETTE 4X48 STRL (DRAPES) ×2 IMPLANT
SUT SILK 2 0 FS (SUTURE) IMPLANT
SUT VICRYL RAPID 5 0 P 3 (SUTURE) IMPLANT
SUT VICRYL RAPIDE 4/0 PS 2 (SUTURE) ×2 IMPLANT
SYR BULB 3OZ (MISCELLANEOUS) ×2 IMPLANT
SYR CONTROL 10ML LL (SYRINGE) ×2 IMPLANT
TOWEL OR 17X24 6PK STRL BLUE (TOWEL DISPOSABLE) ×2 IMPLANT
UNDERPAD 30X30 INCONTINENT (UNDERPADS AND DIAPERS) ×2 IMPLANT

## 2013-03-14 NOTE — H&P (Addendum)
Samantha Norton is a 79 year old right hand dominant female who comes in complaining of a contracture of her right hand primarily ring finger and to a lesser extent the little finger. She is also complaining of numbness and tingling of her hands. She states that she was told she had carpal tunnel syndrome several years ago and was placed on Gabapentin for ringing in her ears. She has no history of injury to her hands or neck. She does not know where her ancestors are from. She has no problems with her feet. There is a family history of diabetes. No history of diabetes, thyroid problems, arthritis or gout. She has been tested for diabetes. She is not complaining of any pain. She is awakened at night by her hands. She apparently was told she had a disc in her neck.   PAST MEDICAL HISTORY: She has no known drug allergies. She is on Avalide, Gabapentin. She relates no surgery.   FAMILY H ISTORY: Positive for diabetes, otherwise negative.  SOCIAL HISTORY: She does not smoke or drink. She is divorced.  REVIEW OF SYSTEMS: Positive for glasses, high BP, otherwise negative for 14 points. Samantha Norton is an 79 y.o. female.   Chief Complaint: dupuytrens rrf HPI: see above  Past Medical History  Diagnosis Date  . Hypertension   . Hyperlipidemia   . Varicella     in high school  . Anemia     thrombocytopenia, seen by hematology.  . Diverticulosis of colon   . GERD (gastroesophageal reflux disease)     no medications. Last EGD 2+ years ago. Had H. Pylori  . Osteoporosis     by patient report - osteopenia  . Diabetes mellitus     no meds  . Wears glasses   . Wears dentures     top    Past Surgical History  Procedure Laterality Date  . Tonsilectomy    . Abdominal hysterectomy      partial, due to fibroid tumors.  . Breast lumpectomy      bilateral biospsies over time x 3-4 , always beng    Family History  Problem Relation Age of Onset  . Hypertension Mother   . Stroke Mother   . Diabetes  Mother   . Stroke Father   . Hypertension Father   . Arthritis Brother     hip problems, in w/c  . Cancer Daughter     breast   Social History:  reports that she quit smoking about 41 years ago. She has never used smokeless tobacco. She reports that she drinks alcohol. She reports that she does not use illicit drugs.  Allergies: No Known Allergies  No prescriptions prior to admission    No results found for this or any previous visit (from the past 48 hour(s)).  No results found.   Pertinent items are noted in HPI.  Height 5\' 2"  (1.575 m), weight 133 lb (60.328 kg).  General appearance: alert, cooperative and appears stated age Head: Normocephalic, without obvious abnormality Neck: no JVD Resp: clear to auscultation bilaterally Cardio: regular rate and rhythm, S1, S2 normal, no murmur, click, rub or gallop GI: soft, non-tender; bowel sounds normal; no masses,  no organomegaly Extremities: extremities normal, atraumatic, no cyanosis or edema Pulses: 2+ and symmetric Skin: Skin color, texture, turgor normal. No rashes or lesions Neurologic: Grossly normal Incision/Wound: na  Assessment/Plan Dupuytren's contracture RRF We have discussed the possibility of fasciotomy of her right ring finger which she has decided to  proceed with. The pre, peri and post op course are discussed along with risks and complications.  She is aware there is no guarantee with surgery, possibility of infection, recurrence, injury to arteries, nerves and tendons, incomplete relief of symptoms and dystrophy.  She is scheduled for fasciotomy of her right ring finger Dupuytren's contracture as an outpatient under regional anesthesia.  Laquesha Holcomb R 03/14/2013, 9:53 AM

## 2013-03-14 NOTE — Anesthesia Procedure Notes (Signed)
Procedure Name: MAC Date/Time: 03/14/2013 12:56 PM Performed by: Nicki Reaper Pre-anesthesia Checklist: Patient identified, Emergency Drugs available, Suction available and Patient being monitored Patient Re-evaluated:Patient Re-evaluated prior to inductionOxygen Delivery Method: Simple face mask Preoxygenation: Pre-oxygenation with 100% oxygen

## 2013-03-14 NOTE — Brief Op Note (Signed)
03/14/2013  1:20 PM  PATIENT:  Samantha Norton  79 y.o. female  PRE-OPERATIVE DIAGNOSIS:  Dupuytren's Contracture Right Ring Finger  POST-OPERATIVE DIAGNOSIS:  Dupuytren's Contracture Right Ring Finger  PROCEDURE:  Procedure(s): FASCIOTOMY RIGHT RING FINGER (Right)  SURGEON:  Surgeon(s) and Role:    * Nicki Reaper, MD - Primary  PHYSICIAN ASSISTANT:   ASSISTANTS: none   ANESTHESIA:   local and regional  EBL:     BLOOD ADMINISTERED:none  DRAINS: none   LOCAL MEDICATIONS USED:  BUPIVICAINE   SPECIMEN:  No Specimen  DISPOSITION OF SPECIMEN:  N/A  COUNTS:  YES  TOURNIQUET:   Total Tourniquet Time Documented: Forearm (Right) - 18 minutes Total: Forearm (Right) - 18 minutes   DICTATION: .Other Dictation: Dictation Number [id number]  PLAN OF CARE: Discharge to home after PACU  PATIENT DISPOSITION:  PACU - hemodynamically stable.

## 2013-03-14 NOTE — Anesthesia Postprocedure Evaluation (Signed)
Anesthesia Post Note  Patient: Samantha Norton  Procedure(s) Performed: Procedure(s) (LRB): FASCIOTOMY RIGHT RING FINGER (Right)  Anesthesia type: general  Patient location: PACU  Post pain: Pain level controlled  Post assessment: Patient's Cardiovascular Status Stable  Last Vitals:  Filed Vitals:   03/14/13 1456  BP: 174/84  Pulse: 57  Temp: 36.5 C  Resp: 16    Post vital signs: Reviewed and stable  Level of consciousness: sedated  Complications: No apparent anesthesia complications

## 2013-03-14 NOTE — Op Note (Signed)
Other Dictation: Dictation Number [id number]

## 2013-03-14 NOTE — Transfer of Care (Signed)
Immediate Anesthesia Transfer of Care Note  Patient: Samantha Norton  Procedure(s) Performed: Procedure(s): FASCIOTOMY RIGHT RING FINGER (Right)  Patient Location: PACU  Anesthesia Type:MAC and Bier block  Level of Consciousness: awake, alert  and oriented  Airway & Oxygen Therapy: Patient Spontanous Breathing and Patient connected to face mask oxygen  Post-op Assessment: Report given to PACU RN, Post -op Vital signs reviewed and stable and Patient moving all extremities  Post vital signs: Reviewed and stable  Complications: No apparent anesthesia complications

## 2013-03-14 NOTE — Anesthesia Preprocedure Evaluation (Signed)
Anesthesia Evaluation  Patient identified by MRN, date of birth, ID band Patient awake    Reviewed: Allergy & Precautions, H&P , NPO status , Patient's Chart, lab work & pertinent test results  Airway Mallampati: I TM Distance: >3 FB Neck ROM: Full    Dental   Pulmonary former smoker,          Cardiovascular hypertension, Pt. on medications     Neuro/Psych    GI/Hepatic GERD-  Medicated and Controlled,  Endo/Other  diabetes, Type 2  Renal/GU      Musculoskeletal   Abdominal   Peds  Hematology   Anesthesia Other Findings   Reproductive/Obstetrics                           Anesthesia Physical Anesthesia Plan  ASA: II  Anesthesia Plan: Bier Block   Post-op Pain Management:    Induction: Intravenous  Airway Management Planned: Simple Face Mask  Additional Equipment:   Intra-op Plan:   Post-operative Plan:   Informed Consent: I have reviewed the patients History and Physical, chart, labs and discussed the procedure including the risks, benefits and alternatives for the proposed anesthesia with the patient or authorized representative who has indicated his/her understanding and acceptance.     Plan Discussed with: CRNA and Surgeon  Anesthesia Plan Comments:         Anesthesia Quick Evaluation

## 2013-03-15 NOTE — Op Note (Signed)
Samantha Norton, HARLAN.:  [id number]  MEDICAL RECORD NO.:  [id number]  LOCATION:                                 FACILITY:  PHYSICIAN:  Cindee Salt, M.D.            DATE OF BIRTH:  DATE OF PROCEDURE:  03/14/2013 DATE OF DISCHARGE:                              OPERATIVE REPORT   PREOPERATIVE DIAGNOSIS:  Dupuytren contracture, right ring finger.  POSTOPERATIVE DIAGNOSIS:  Dupuytren contracture, right ring finger.  OPERATION:  Fasciotomy, right ring finger.  SURGEON:  Cindee Salt, MD  ANESTHESIA:  Forearm-based IV regional with local infiltration.  ANESTHESIOLOGIST:  Kaylyn Layer. Michelle Piper, MD  HISTORY:  The patient is a 79 year old female with a history of Dupuytren contracture to the ring finger.  She is elected to undergo fasciotomy.  She is aware of risks and complications including infection; recurrence of injury to arteries, nerves, tendons; incomplete relief of symptoms; dystrophy; possibility of recurrence.  She is aware of other options including aponeurotomy, Xiaflex injection, fasciectomy. In the preoperative area, the patient was seen, the extremity marked by both the patient and surgeon.  Antibiotic given.  PROCEDURE IN DETAIL:  The patient was brought to the operating room where a forearm-based IV regional anesthetic carried out without difficulty.  She was prepped using ChloraPrep in supine position, right arm free.  A 3-minute dry time was allowed.  Time-out taken, confirming the patient and procedure.  After adequate anesthesia was afforded, the areas were injected with 0.25% Marcaine without epinephrine.  Three areas of fasciotomy were performed, taking care to protect neurovascular bundles.  This allowed the finger to come fully straight.  The wounds were irrigated.  The digital nerves were identified and protected throughout the procedure.  A sterile compressive dressing was applied after closure of each of the wounds with interrupted 4-0  Vicryl Rapide sutures.  A compressive dressing was applied.  On deflation of the tourniquet, all fingers immediately pinked.  She was taken to the recovery room for observation in satisfactory condition.  She will be discharged home to return in 1 week on Vicodin.          ______________________________ Cindee Salt, M.D.    GK/MEDQ  D:  03/14/2013  T:  03/15/2013  Job:  [id number]

## 2013-03-19 ENCOUNTER — Encounter (HOSPITAL_BASED_OUTPATIENT_CLINIC_OR_DEPARTMENT_OTHER): Payer: Self-pay | Admitting: Orthopedic Surgery

## 2013-04-27 ENCOUNTER — Ambulatory Visit (INDEPENDENT_AMBULATORY_CARE_PROVIDER_SITE_OTHER): Payer: Medicare Other | Admitting: Family Medicine

## 2013-04-27 ENCOUNTER — Encounter: Payer: Self-pay | Admitting: Family Medicine

## 2013-04-27 VITALS — BP 142/80 | HR 65 | Ht 62.0 in | Wt 131.0 lb

## 2013-04-27 DIAGNOSIS — E785 Hyperlipidemia, unspecified: Secondary | ICD-10-CM | POA: Diagnosis not present

## 2013-04-27 DIAGNOSIS — Z23 Encounter for immunization: Secondary | ICD-10-CM

## 2013-04-27 DIAGNOSIS — M720 Palmar fascial fibromatosis [Dupuytren]: Secondary | ICD-10-CM | POA: Diagnosis not present

## 2013-04-27 DIAGNOSIS — I10 Essential (primary) hypertension: Secondary | ICD-10-CM

## 2013-04-27 NOTE — Patient Instructions (Signed)
Continue with the blood pressure medicine. Your blood pressure looks great! Continue exercising on a regular basis and like we talked about, watch your salt intake.   Follow up in 6 months.

## 2013-04-28 NOTE — Assessment & Plan Note (Signed)
Just recently had surgery and doing well

## 2013-04-28 NOTE — Assessment & Plan Note (Signed)
ASCVD risk score calculated to be not in statin benefit group due to age>78yo.  - continued exercise and balanced diet.

## 2013-04-28 NOTE — Progress Notes (Signed)
Patient ID: MERCED BROUGHAM    DOB: [birth year], 80 y.o.   MRN: [id number] --- Subjective:  Laelani is a 80 y.o.female with PMH of HTN, Hyperlipidemia who presents for follow up on HTN.   # Hypertension: Medications: irbesartan/hydrochlorothiazide 150/12.5 bid Number of doses missed in 1 week: 0 BP at home: 329-924 systolic Exercise routine: walks daily Salt in diet: small amount Chest pain: none Lower extremity swelling: none Shortness of breath: none Headache: none Change in vision: none  Last BMP and lipid panel: BMET    Component Value Date/Time   NA 142 03/07/2013 1100   NA 141 02/11/2012 1441   K 3.8 03/07/2013 1100   K 4.1 02/11/2012 1441   CL 106 03/07/2013 1100   CL 107 02/11/2012 1441   CO2 29 03/07/2013 1100   CO2 29 02/11/2012 1441   GLUCOSE 92 03/07/2013 1100   GLUCOSE 165* 02/11/2012 1441   BUN 17 03/07/2013 1100   BUN 20.0 02/11/2012 1441   CREATININE 0.93 03/07/2013 1100   CREATININE 1.01 01/22/2013 1531   CREATININE 0.9 02/11/2012 1441   CALCIUM 9.9 03/07/2013 1100   CALCIUM 10.1 02/11/2012 1441   GFRNONAA 57* 03/07/2013 1100   GFRAA 66* 03/07/2013 1100   Lipid Panel     Component Value Date/Time   CHOL 197 12/26/2012 0905   TRIG 68 12/26/2012 0905   HDL 63 12/26/2012 0905   CHOLHDL 3.1 12/26/2012 0905   VLDL 14 12/26/2012 0905   LDLCALC 120* 12/26/2012 0905    ROS: see HPI Past Medical History: reviewed and updated medications and allergies. Social History: Tobacco: none  Objective: Filed Vitals:   04/27/13 0956  BP: 142/80  Pulse: 65    Physical Examination:   General appearance - alert, well appearing, and in no distress Chest - clear to auscultation, no wheezes, rales or rhonchi, symmetric air entry Heart - normal rate, regular rhythm, normal S1, S2, no murmurs Extremities - no pedal edema

## 2013-04-28 NOTE — Assessment & Plan Note (Addendum)
At goal of <150/90 for age and co-morbidities both in office and at home.  - continue current irbesartan/hctz 150/12.5 bid - reviewed low salt diet and gave handout - continue daily walking - follow up in 6 months

## 2013-06-27 ENCOUNTER — Encounter: Payer: Self-pay | Admitting: Family Medicine

## 2013-06-27 ENCOUNTER — Ambulatory Visit (INDEPENDENT_AMBULATORY_CARE_PROVIDER_SITE_OTHER): Payer: Federal, State, Local not specified - PPO | Admitting: Family Medicine

## 2013-06-27 VITALS — BP 172/90 | HR 66 | Temp 98.1°F | Ht 62.0 in | Wt 134.0 lb

## 2013-06-27 DIAGNOSIS — I10 Essential (primary) hypertension: Secondary | ICD-10-CM | POA: Diagnosis not present

## 2013-06-27 DIAGNOSIS — M25462 Effusion, left knee: Secondary | ICD-10-CM

## 2013-06-27 DIAGNOSIS — M25469 Effusion, unspecified knee: Secondary | ICD-10-CM | POA: Diagnosis not present

## 2013-06-27 MED ORDER — IRBESARTAN-HYDROCHLOROTHIAZIDE 150-12.5 MG PO TABS: 1.0000 | ORAL_TABLET | Freq: Two times a day (BID) | ORAL | Status: DC

## 2013-06-27 NOTE — Patient Instructions (Signed)
The knee looks like it has a little bit of extra fluid on it. This can occur with osteoarthritis or with wear and tear of the knee.  I would like to treat it with the following:  - ice 3-4 times per day.  - compression of the knee. You can get a knee sleeve to wear on the knee that should help with the fluid.   If it doesn't get better or gets worst in the next 2 weeks, please call the clinic and I will get you set up with our sports medicine doctors to do an ultrasound of the knee for further evaluation.   For the blood pressure, please continue checking your blood pressures at home. If you have readings greater than 150/90 on 2 different occasions, give me a call and I will call in another medicine for the blood pressure.

## 2013-06-29 DIAGNOSIS — M25462 Effusion, left knee: Secondary | ICD-10-CM | POA: Insufficient documentation

## 2013-06-29 NOTE — Assessment & Plan Note (Signed)
Soft tissue swelling. Doesn't seem to be any significant effusion to drain.  Doesn't appear to be gout or infection. No evidence of ligament tear.  - conservative management for now with compression and icing.  - if not better or worst, return to care.

## 2013-06-29 NOTE — Progress Notes (Signed)
Patient ID: Samantha Norton    DOB: [birth year], 80 y.o.   MRN: [id number] --- Subjective:  Samantha Norton is a 80 y.o.female who presents with concern of left knee swelling.  Noticed it 2 weeks ago. Located on lateral aspect of knee. No associated pain. Swelling is worst after a day of being on her feet. Swelling is much reduced in the morning after sleeping. No redness. No warmth. No trauma. No change in activity. She has applied topical ointments which have helped.   - BP: checks it at home and it usually runs in the 140's. No chest pain, no shortness of breath, no headache. Is not able to sleep.   ROS: see HPI Past Medical History: reviewed and updated medications and allergies. Social History: Tobacco: none  Objective: Filed Vitals:   06/27/13 1512  BP: 172/90  Pulse: 66  Temp: 98.1 F (36.7 C)    Physical Examination:   General appearance - alert, well appearing, and in no distress Left knee - mild soft tissue swelling on lateral aspect of joint, no tenderness to palpation, no warmth, no erythema, full range of motion with flexion and extension and lower leg rotation.  Ligaments with solid consistent endpoints including ACL, PCL, LCL, MCL.  Negative Mcmurray's and provocative meniscal tests.  Non painful patellar compression.

## 2013-06-29 NOTE — Assessment & Plan Note (Addendum)
Not well controled today but patient reports good control at home, which makes me hesitant to start another agent.  Recommended that patient check BP's at home every day. If BP >150/90 on 2 different occasions, patient to call me back and will add amlodipine at that time.

## 2013-07-31 ENCOUNTER — Other Ambulatory Visit: Payer: Self-pay | Admitting: Family Medicine

## 2013-08-10 ENCOUNTER — Telehealth: Payer: Self-pay | Admitting: Oncology

## 2013-08-10 ENCOUNTER — Encounter: Payer: Self-pay | Admitting: Oncology

## 2013-08-10 ENCOUNTER — Ambulatory Visit (HOSPITAL_BASED_OUTPATIENT_CLINIC_OR_DEPARTMENT_OTHER): Payer: Federal, State, Local not specified - PPO | Admitting: Oncology

## 2013-08-10 ENCOUNTER — Other Ambulatory Visit (HOSPITAL_BASED_OUTPATIENT_CLINIC_OR_DEPARTMENT_OTHER): Payer: Medicare Other

## 2013-08-10 VITALS — BP 187/78 | HR 61 | Temp 98.0°F | Resp 20 | Ht 62.0 in | Wt 134.7 lb

## 2013-08-10 DIAGNOSIS — D649 Anemia, unspecified: Secondary | ICD-10-CM

## 2013-08-10 DIAGNOSIS — D696 Thrombocytopenia, unspecified: Secondary | ICD-10-CM

## 2013-08-10 LAB — CBC WITH DIFFERENTIAL/PLATELET
BASO%: 1.9 % (ref 0.0–2.0)
Basophils Absolute: 0.1 10*3/uL (ref 0.0–0.1)
EOS%: 4.3 % (ref 0.0–7.0)
Eosinophils Absolute: 0.2 10*3/uL (ref 0.0–0.5)
HCT: 39.1 % (ref 34.8–46.6)
HGB: 12.3 g/dL (ref 11.6–15.9)
LYMPH#: 1.4 10*3/uL (ref 0.9–3.3)
LYMPH%: 36.1 % (ref 14.0–49.7)
MCH: 28.2 pg (ref 25.1–34.0)
MCHC: 31.6 g/dL (ref 31.5–36.0)
MCV: 89.3 fL (ref 79.5–101.0)
MONO#: 0.3 10*3/uL (ref 0.1–0.9)
MONO%: 6.9 % (ref 0.0–14.0)
NEUT#: 2 10*3/uL (ref 1.5–6.5)
NEUT%: 50.8 % (ref 38.4–76.8)
PLATELETS: 97 10*3/uL — AB (ref 145–400)
RBC: 4.37 10*6/uL (ref 3.70–5.45)
RDW: 13.9 % (ref 11.2–14.5)
WBC: 4 10*3/uL (ref 3.9–10.3)

## 2013-08-10 LAB — COMPREHENSIVE METABOLIC PANEL (CC13)
ALK PHOS: 60 U/L (ref 40–150)
ALT: 16 U/L (ref 0–55)
AST: 20 U/L (ref 5–34)
Albumin: 3.8 g/dL (ref 3.5–5.0)
Anion Gap: 9 mEq/L (ref 3–11)
BUN: 18 mg/dL (ref 7.0–26.0)
CO2: 26 mEq/L (ref 22–29)
CREATININE: 1.1 mg/dL (ref 0.6–1.1)
Calcium: 10 mg/dL (ref 8.4–10.4)
Chloride: 107 mEq/L (ref 98–109)
Glucose: 127 mg/dl (ref 70–140)
Potassium: 3.4 mEq/L — ABNORMAL LOW (ref 3.5–5.1)
Sodium: 142 mEq/L (ref 136–145)
Total Bilirubin: 0.73 mg/dL (ref 0.20–1.20)
Total Protein: 7.2 g/dL (ref 6.4–8.3)

## 2013-08-10 NOTE — Telephone Encounter (Signed)
Gave pt appt for November 2015 lab and MD

## 2013-08-10 NOTE — Progress Notes (Signed)
Hematology and Oncology Follow Up Visit  Samantha Norton [id number] May 12, 1931 80 y.o. 08/10/2013 1:46 PM     Principle Diagnosis: This is a 80 year old female initially diagnosed with normocytic normochromic anemia and mild thrombocytopenia diagnosed in 2010. She also has a mild fluctuating thrombocytopenia.  Current therapy: Observation and surveillance.    Interim History:  Samantha Norton presents today for a followup visit.  This is a pleasant 80 year old female who we saw initially back in December 2010 for evaluation of her  anemia and thrombocytopenia.  Her hemoglobins continues to be stable at this point.  She does have mild thrombocytopenia as well.  Platelet count ranged between 100-120,000.  Since the last time I saw her, she had not reported any major changes in her performance status, had not had any appetite changes, had not had any constitutional symptoms.  Activity level remains stable at this point.  Has not had any recent bleeding episodes. She has not reported any headaches or change in her mental status. Is not reporting any hospitalization or illnesses.  Medications: I have reviewed the patient's current medications.   Current Outpatient Prescriptions  Medication Sig Dispense Refill  . aspirin 81 MG tablet Take 81 mg by mouth daily.        . AVALIDE 150-12.5 MG per tablet TAKE 2 TABLETS DAILY  180 tablet  1  . calcium-vitamin D (OSCAL WITH D) 250-125 MG-UNIT per tablet Take 1 tablet by mouth daily.        . fish oil-omega-3 fatty acids 1000 MG capsule Take 1 g by mouth daily.      Marland Kitchen FLUVIRIN PRESERVATIVE FREE SUSP injection       . gabapentin (NEURONTIN) 300 MG capsule Take 1 capsule (300 mg total) by mouth at bedtime.  30 capsule  6  . HYDROcodone-acetaminophen (NORCO) 5-325 MG per tablet Take 1 tablet by mouth every 6 (six) hours as needed for moderate pain.  30 tablet  0  . irbesartan-hydrochlorothiazide (AVALIDE) 150-12.5 MG per tablet Take 1 tablet by mouth 2 (two) times  daily.  60 tablet  6  . multivitamin (THERAGRAN) per tablet Take 1 tablet by mouth daily.        Marland Kitchen omega-3 acid ethyl esters (LOVAZA) 1 G capsule Take 1 g by mouth daily.        Marland Kitchen PATADAY 0.2 % SOLN        No current facility-administered medications for this visit.    Allergies: No Known Allergies  Past Medical History, Surgical history, Social history, and Family History were reviewed and updated.  Review of Systems: Constitutional:  Negative for fever, chills, night sweats, anorexia, weight loss, pain. Cardiovascular: no chest pain or dyspnea on exertion  Remaining ROS negative. Physical Exam: Blood pressure 187/78, pulse 61, temperature 98 F (36.7 C), temperature source Oral, resp. rate 20, height 5\' 2"  (1.575 m), weight 134 lb 11.2 oz (61.1 kg), SpO2 98.00%. ECOG: 1 General appearance: alert and awake not in any distress. Head: Normocephalic, without obvious abnormality, atraumatic Neck: no adenopathy, no carotid bruit, no JVD, supple, symmetrical, trachea midline and thyroid not enlarged, symmetric, no tenderness/mass/nodules Lymph nodes: Cervical, supraclavicular, and axillary nodes normal. Heart:regular rate and rhythm, S1, S2 normal, no murmur, click, rub or gallop Lung:chest clear, no wheezing, rales, normal symmetric air entry. Abdomin: soft, non-tender, without masses or organomegaly EXT:no erythema, induration, or nodules Skin: no rash, no petechia    Lab Results: Lab Results  Component Value Date   WBC 4.0 08/10/2013  HGB 12.3 08/10/2013   HCT 39.1 08/10/2013   MCV 89.3 08/10/2013   PLT 97* 08/10/2013     Chemistry      Component Value Date/Time   NA 142 03/07/2013 1100   NA 141 02/11/2012 1441   K 3.8 03/07/2013 1100   K 4.1 02/11/2012 1441   CL 106 03/07/2013 1100   CL 107 02/11/2012 1441   CO2 29 03/07/2013 1100   CO2 29 02/11/2012 1441   BUN 17 03/07/2013 1100   BUN 20.0 02/11/2012 1441   CREATININE 0.93 03/07/2013 1100   CREATININE 1.01 01/22/2013 1531    CREATININE 0.9 02/11/2012 1441      Component Value Date/Time   CALCIUM 9.9 03/07/2013 1100   CALCIUM 10.1 02/11/2012 1441   ALKPHOS 61 12/26/2012 0905   ALKPHOS 67 02/11/2012 1441   AST 25 12/26/2012 0905   AST 38* 02/11/2012 1441   ALT 21 12/26/2012 0905   ALT 40 02/11/2012 1441   BILITOT 0.7 12/26/2012 0905   BILITOT 0.82 02/11/2012 1441       Impression and Plan:  This is a 80 year old female with the following issues. 1. Anemia. Normochromic normocytic in the past, but have been really normal in the last 5 years.  I do not see any evidence of any vitamin deficiency.  Really no further workup in management from an anemia standpoint at this time. 2. Mild thrombocytopenia.  Platelet counts have been close 100,000.  Differential diagnosis include medication versus autoimmune phenomenon such as idiopathic thrombocytopenic purpura.  I doubt that this is an early sign of myelodysplasia, but will continue to monitor it.  Repeat blood counts in about 6 months.   Wyatt Portela, MD 5/1/20151:46 PM

## 2013-09-20 ENCOUNTER — Ambulatory Visit (INDEPENDENT_AMBULATORY_CARE_PROVIDER_SITE_OTHER): Payer: Medicare Other | Admitting: Family Medicine

## 2013-09-20 ENCOUNTER — Encounter: Payer: Self-pay | Admitting: Family Medicine

## 2013-09-20 VITALS — BP 160/88 | HR 70 | Ht 62.0 in | Wt 132.0 lb

## 2013-09-20 DIAGNOSIS — M25469 Effusion, unspecified knee: Secondary | ICD-10-CM

## 2013-09-20 DIAGNOSIS — M25462 Effusion, left knee: Secondary | ICD-10-CM

## 2013-09-20 DIAGNOSIS — I10 Essential (primary) hypertension: Secondary | ICD-10-CM

## 2013-09-20 MED ORDER — IRBESARTAN-HYDROCHLOROTHIAZIDE 150-12.5 MG PO TABS: ORAL_TABLET | ORAL | Status: DC

## 2013-09-20 NOTE — Patient Instructions (Signed)
Please make an appointment for a nurse visit to check your blood pressure and compare your home blood pressure machine readings.

## 2013-09-23 NOTE — Assessment & Plan Note (Signed)
No evidence of gout or septic joint. No associated pain. Swelling Improving from prior. Likely OA changes. If continues to be an issue, may want to get plain XRay to evaluate presence of OA. Continue knee sleeve and ice as needed.

## 2013-09-23 NOTE — Assessment & Plan Note (Signed)
Uncontrolled in office but better at home. Patient on Avalide which she prefers to take as brand name. Discussed starting amlodipine but she is very hesitant to add extra medicine especially since readings at home are better controlled.  - Asked her to return for a nurse visit for BP check and bring her home BP cuff to compare readings. If home cuff not consistent with office reading and reading too low at home, will add medicine. If home BP cuff accurate, will consider continuing to monitor.

## 2013-09-23 NOTE — Progress Notes (Signed)
Patient ID: Samantha Norton    DOB: [birth year], 80 y.o.   MRN: [id number] --- Subjective:  Samantha Norton is a 80 y.o.female who Presents for follow up on HTN and follow up on left knee swelling:  - HTN: BP's at home: highest: 150 SBP, average of 130-140 SBP at home. DBPs are usually less than 90. No chest pain, no sob, no lower ext swelling, no headache, no double vision.  - Knee swelling: has improved since last visit. She has been icing it at night and using the knee sleeve during the day. She had a brief occasion of pain in the medial aspect of the knee but otherwise no pain. Able to walk, no popping, no locking. No lower extremity weakness. No fevers. Joint is not hot or red.   ROS: see HPI Past Medical History: reviewed and updated medications and allergies. Social History: Tobacco: none  Objective: Filed Vitals:   09/20/13 1109  BP: 160/88  Pulse: 70    Physical Examination:  Gen: Alert and oriented x4, no acute distress, well appearing CV: S1S2, RRR, no murmur Pulm: cta b/l, normal resp effort.  Extr: no pedal edema Left knee: mild soft tissue swelling on lateral aspect of knee compared to right knee. No tenderness to palpation, normal range of motion. Normal strength.

## 2013-10-03 ENCOUNTER — Ambulatory Visit (INDEPENDENT_AMBULATORY_CARE_PROVIDER_SITE_OTHER): Payer: Medicare Other | Admitting: *Deleted

## 2013-10-03 ENCOUNTER — Telehealth: Payer: Self-pay | Admitting: Family Medicine

## 2013-10-03 VITALS — BP 183/93 | HR 73

## 2013-10-03 DIAGNOSIS — I10 Essential (primary) hypertension: Secondary | ICD-10-CM

## 2013-10-03 MED ORDER — AMLODIPINE BESYLATE 5 MG PO TABS: 5.0000 mg | ORAL_TABLET | Freq: Every day | ORAL | Status: DC

## 2013-10-03 NOTE — Telephone Encounter (Signed)
Called patient and left messages on both cell and home phone numbers. Left message stating that we would start her on another blood pressure medicine since BP's in the office today were high.  Sending amlodipine 5mg  daily to the pharmacy.   Please call patient to make sure she received message and also to ask her to follow up with a doctor in the clinic 2-3 days after starting the medication.   Thank you!  Liam Graham, PGY-3 Family Medicine Resident

## 2013-10-03 NOTE — Progress Notes (Signed)
Pt. Here today for blood pressure check PCP also requested, having Pt. Here also checked with home BP cuff. BP- 208/107 (L), P- 71 and BP- 209/110 (R), P- 69. Pt. States home BP readings today were 144/80, 130/87, 140/83. Pt. Denies headache, blurred vision, dizziness or missing any medication doses. Will route note to PCP for advice. Burna Forts, BSN, RN-BC

## 2013-10-04 NOTE — Telephone Encounter (Signed)
Called and verified with patient that she did receive message from Dr Otis Dials. I also reminded her to make an appointment a few days after she starts the medication to recheck her BP.Busick, Kevin Fenton

## 2014-02-12 ENCOUNTER — Other Ambulatory Visit (HOSPITAL_BASED_OUTPATIENT_CLINIC_OR_DEPARTMENT_OTHER): Payer: Medicare Other

## 2014-02-12 ENCOUNTER — Telehealth: Payer: Self-pay | Admitting: Oncology

## 2014-02-12 ENCOUNTER — Ambulatory Visit (HOSPITAL_BASED_OUTPATIENT_CLINIC_OR_DEPARTMENT_OTHER): Payer: Medicare Other | Admitting: Oncology

## 2014-02-12 VITALS — BP 180/84 | HR 61 | Temp 98.4°F | Resp 18 | Ht 62.0 in | Wt 135.6 lb

## 2014-02-12 DIAGNOSIS — D696 Thrombocytopenia, unspecified: Secondary | ICD-10-CM

## 2014-02-12 DIAGNOSIS — D649 Anemia, unspecified: Secondary | ICD-10-CM

## 2014-02-12 DIAGNOSIS — D61818 Other pancytopenia: Secondary | ICD-10-CM

## 2014-02-12 LAB — CBC WITH DIFFERENTIAL/PLATELET
BASO%: 2.5 % — ABNORMAL HIGH (ref 0.0–2.0)
BASOS ABS: 0.1 10*3/uL (ref 0.0–0.1)
EOS%: 4.3 % (ref 0.0–7.0)
Eosinophils Absolute: 0.2 10*3/uL (ref 0.0–0.5)
HCT: 38.9 % (ref 34.8–46.6)
HEMOGLOBIN: 12.3 g/dL (ref 11.6–15.9)
LYMPH#: 1.5 10*3/uL (ref 0.9–3.3)
LYMPH%: 42 % (ref 14.0–49.7)
MCH: 28.2 pg (ref 25.1–34.0)
MCHC: 31.6 g/dL (ref 31.5–36.0)
MCV: 89.3 fL (ref 79.5–101.0)
MONO#: 0.3 10*3/uL (ref 0.1–0.9)
MONO%: 7 % (ref 0.0–14.0)
NEUT%: 44.2 % (ref 38.4–76.8)
NEUTROS ABS: 1.6 10*3/uL (ref 1.5–6.5)
Platelets: 111 10*3/uL — ABNORMAL LOW (ref 145–400)
RBC: 4.35 10*6/uL (ref 3.70–5.45)
RDW: 13.2 % (ref 11.2–14.5)
WBC: 3.6 10*3/uL — ABNORMAL LOW (ref 3.9–10.3)

## 2014-02-12 LAB — COMPREHENSIVE METABOLIC PANEL (CC13)
ALT: 16 U/L (ref 0–55)
ANION GAP: 9 meq/L (ref 3–11)
AST: 21 U/L (ref 5–34)
Albumin: 3.8 g/dL (ref 3.5–5.0)
Alkaline Phosphatase: 57 U/L (ref 40–150)
BUN: 18.5 mg/dL (ref 7.0–26.0)
CALCIUM: 9.8 mg/dL (ref 8.4–10.4)
CHLORIDE: 105 meq/L (ref 98–109)
CO2: 26 meq/L (ref 22–29)
Creatinine: 1.2 mg/dL — ABNORMAL HIGH (ref 0.6–1.1)
GLUCOSE: 133 mg/dL (ref 70–140)
Potassium: 3.9 mEq/L (ref 3.5–5.1)
Sodium: 141 mEq/L (ref 136–145)
Total Bilirubin: 0.91 mg/dL (ref 0.20–1.20)
Total Protein: 7.1 g/dL (ref 6.4–8.3)

## 2014-02-12 NOTE — Telephone Encounter (Signed)
gv adn printeda ppt sched and avs for pt for NOV 2016 °

## 2014-02-12 NOTE — Progress Notes (Signed)
Hematology and Oncology Follow Up Visit  Samantha Norton [id number] Jun 16, 1931 80 y.o. 02/12/2014 1:30 PM     Principle Diagnosis: This is a 80 year old female initially diagnosed with normocytic normochromic anemia and mild thrombocytopenia diagnosed in 2010. She also has a mild fluctuating thrombocytopenia.  Current therapy: Observation and surveillance.    Interim History:  Samantha Norton presents today for a followup visit. Since the last time I saw her, she has been doing very well. She had not reported any major changes in her performance status, had not had any appetite changes, had not had any constitutional symptoms.  Activity level remains stable at this point.  Has not had any recent bleeding episodes. She has not reported any headaches or change in her mental status. Is not reporting any hospitalization or illnesses. She has not reported any chest pain, shortness of breath or difficulty breathing. She has not reported any cough or hemoptysis. Does not report any nausea, vomiting or abdominal pain. She has not reported any frequency urgency or hesitancy. Rest of her review of systems unremarkable.  Medications: I have reviewed the patient's current medications.   Current Outpatient Prescriptions  Medication Sig Dispense Refill  . amLODipine (NORVASC) 5 MG tablet Take 1 tablet (5 mg total) by mouth daily. 90 tablet 3  . aspirin 81 MG tablet Take 81 mg by mouth daily.      . calcium-vitamin D (OSCAL WITH D) 250-125 MG-UNIT per tablet Take 1 tablet by mouth daily.      Marland Kitchen FLUVIRIN PRESERVATIVE FREE SUSP injection     . irbesartan-hydrochlorothiazide (AVALIDE) 150-12.5 MG per tablet TAKE 2 TABLETS DAILY 180 tablet 4  . multivitamin (THERAGRAN) per tablet Take 1 tablet by mouth daily.      Marland Kitchen omega-3 acid ethyl esters (LOVAZA) 1 G capsule Take 1 g by mouth daily.      Marland Kitchen PATADAY 0.2 % SOLN      No current facility-administered medications for this visit.    Allergies: No Known  Allergies  Past Medical History, Surgical history, Social history, and Family History were reviewed and updated.    Remaining ROS negative. Physical Exam: Blood pressure 180/84, pulse 61, temperature 98.4 F (36.9 C), temperature source Oral, resp. rate 18, height 5\' 2"  (1.575 m), weight 135 lb 9.6 oz (61.508 kg), SpO2 98 %. ECOG: 1 General appearance: alert and awake not in any distress. Head: Normocephalic, without obvious abnormality, atraumatic Neck: no adenopathy Lymph nodes: Cervical, supraclavicular, and axillary nodes normal. Heart:regular rate and rhythm, S1, S2 normal, no murmur, click, rub or gallop Lung:chest clear, no wheezing, rales, normal symmetric air entry. Abdomin: soft, non-tender, without masses or organomegaly EXT:no erythema, induration, or nodules Skin: no rash, no petechia    Lab Results: Lab Results  Component Value Date   WBC 3.6* 02/12/2014   HGB 12.3 02/12/2014   HCT 38.9 02/12/2014   MCV 89.3 02/12/2014   PLT 111* 02/12/2014     Chemistry      Component Value Date/Time   NA 142 08/10/2013 1321   NA 142 03/07/2013 1100   K 3.4* 08/10/2013 1321   K 3.8 03/07/2013 1100   CL 106 03/07/2013 1100   CL 107 02/11/2012 1441   CO2 26 08/10/2013 1321   CO2 29 03/07/2013 1100   BUN 18.0 08/10/2013 1321   BUN 17 03/07/2013 1100   CREATININE 1.1 08/10/2013 1321   CREATININE 0.93 03/07/2013 1100   CREATININE 1.01 01/22/2013 1531      Component  Value Date/Time   CALCIUM 10.0 08/10/2013 1321   CALCIUM 9.9 03/07/2013 1100   ALKPHOS 60 08/10/2013 1321   ALKPHOS 61 12/26/2012 0905   AST 20 08/10/2013 1321   AST 25 12/26/2012 0905   ALT 16 08/10/2013 1321   ALT 21 12/26/2012 0905   BILITOT 0.73 08/10/2013 1321   BILITOT 0.7 12/26/2012 0905       Impression and Plan:  This is a 80 year old female with the following issues. 1. Anemia. Normochromic normocytic in the past, but have been really normal in the last 5 years.  I do not see any  evidence of any vitamin deficiency.  No intervention is needed. 2. Mild thrombocytopenia.  Platelet counts have been close 100,000.  Differential diagnosis include medication versus autoimmune phenomenon such as idiopathic thrombocytopenic purpura.  I doubt that this is an early sign of myelodysplasia, but will continue to monitor it.  No intervention is needed. 3.     Follow-up: Will be in 12 months. Sooner if she develops any symptoms of bleeding which she was counseled on today.   KJZPHX,TAVWP, MD 11/3/20151:30 PM

## 2014-02-13 ENCOUNTER — Ambulatory Visit: Payer: Medicare Other | Admitting: Family Medicine

## 2014-02-21 ENCOUNTER — Ambulatory Visit (INDEPENDENT_AMBULATORY_CARE_PROVIDER_SITE_OTHER): Payer: Medicare Other | Admitting: Family Medicine

## 2014-02-21 ENCOUNTER — Encounter: Payer: Self-pay | Admitting: Family Medicine

## 2014-02-21 ENCOUNTER — Ambulatory Visit (INDEPENDENT_AMBULATORY_CARE_PROVIDER_SITE_OTHER): Payer: Medicare Other | Admitting: *Deleted

## 2014-02-21 VITALS — BP 188/99 | HR 62 | Temp 97.8°F | Ht 62.0 in | Wt 132.0 lb

## 2014-02-21 DIAGNOSIS — Z23 Encounter for immunization: Secondary | ICD-10-CM

## 2014-02-21 DIAGNOSIS — I10 Essential (primary) hypertension: Secondary | ICD-10-CM | POA: Diagnosis not present

## 2014-02-21 NOTE — Patient Instructions (Signed)
Thank you for coming in, today!  Your blood pressure today was about 170-180 / 100. I think you tend to have higher pressure in the office than at home Your home numbers sound reasonably controlled with the Avalide and amlodipine.  Keep checking your pressure at home every day or every other day. If you consistently have pressures over 150 / 95, call me, and we'll see if we need to change anything. If you have anything like chest pain, trouble breathing, bad headaches, changes in vision, or weakness, numbness, or slurred speech, call us right away or go straight to the emergency room.  Plan to come back to see me after the first of the year, in February or March. Call or come back sooner if you need. Next time I see you, we'll talk more about screening tests, vaccines, and so on.  Please feel free to call with any questions or concerns at any time, at [phone]. --Dr. Venetia Maxon

## 2014-02-21 NOTE — Progress Notes (Signed)
   Subjective:    Patient ID: Samantha Norton, female    DOB: 1931/05/17, 80 y.o.   MRN: [id number]  HPI: Pt presents to clinic for f/u of HTN and to meet me as her new PCP; she previously saw Dr. Otis Dials.  HTN - reports compliance with irbesartan-HCTZ twice a day (doesn't miss doses very often) and amlodipine (misses 1-2 doses per week) - takes Avalide BID and amlodipine at night - denies side effects with either medication; specifically denies LE edema, SOB, chest pain, headaches, or change in vision - reports her home BP readings are typically around 140, with the highest being 150's - has had ambulatory BP monitoring in the past year with Dr. Valentina Lucks, and home / ambulatory readings were typically much more controlled than office readings  Review of Systems: as above. Generally feels well. Due for annual wellness visit.     Objective:   Physical Exam BP 188/99 mmHg  Pulse 62  Temp(Src) 97.8 F (36.6 C) (Oral)  Ht 5\' 2"  (1.575 m)  Wt 132 lb (59.875 kg)  BMI 24.14 kg/m2 Manual recheck BP 174/100 Gen: well-appearing elderly adult female in NAD, very pleasant HEENT: [place]/AT, EOMI, PERRLA, MMM Cardio: RRR, no murmur appreciated Pulm: CTAB, no wheezes, normal WOB Abd: soft, nontender, BS+ Ext: warm, well-perfused, no LE edema     Assessment & Plan:  See problem list note.

## 2014-02-21 NOTE — Assessment & Plan Note (Signed)
A: BP continues to be controlled at home but elevated in-office, but completely asymptomatic. Pt reports reasonable compliance with medications and does not need refills. Ambulatory blood pressure monitoring earlier this year more consistent with home readings than with office readings.  P: Continue Avalide150-12.5 mg BID and amlodipine 5 mg daily; pt resistant to increasing amlodipine to 10 mg, at this time. Advised regular checks of BP at home; if BP consistently over 150 / 95, advised pt to call or return to clinic. Pt to f/u otherwise in 3-4 months, and will conduct full wellness-type visit at that time, adjusting BP meds if necessary and discussing screenings, vaccinations, etc.

## 2014-04-10 ENCOUNTER — Ambulatory Visit (INDEPENDENT_AMBULATORY_CARE_PROVIDER_SITE_OTHER): Payer: Medicare Other | Admitting: Family Medicine

## 2014-04-10 ENCOUNTER — Encounter: Payer: Self-pay | Admitting: Family Medicine

## 2014-04-10 VITALS — BP 148/81 | HR 77 | Temp 98.1°F | Ht 62.0 in | Wt 131.7 lb

## 2014-04-10 DIAGNOSIS — J309 Allergic rhinitis, unspecified: Secondary | ICD-10-CM | POA: Diagnosis not present

## 2014-04-10 MED ORDER — MOMETASONE FUROATE 50 MCG/ACT NA SUSP: 2.0000 | Freq: Every day | NASAL | Status: DC

## 2014-04-10 NOTE — Patient Instructions (Signed)

## 2014-04-10 NOTE — Progress Notes (Signed)
  Subjective:     Samantha Norton is a 80 y.o. female who presents for evaluation of symptoms of a URI. Symptoms include congestion, cough described as productive and sneezing. Onset of symptoms was a few days ago, and has been gradually improving since that time. Treatment to date: Netti pot, saline spray, lemon honey which has been helping. Some history of allergies. No known sick contacts. No wheezing.  The following portions of the patient's history were reviewed and updated as appropriate: allergies, current medications, past medical history and problem list.  Review of Systems Pertinent items are noted in HPI.   Objective:   BP 148/81 mmHg  Pulse 77  Temp(Src) 98.1 F (36.7 C) (Oral)  Ht 5\' 2"  (1.575 m)  Wt 131 lb 11.2 oz (59.739 kg)  BMI 24.08 kg/m2  General: well appearing female, no distress HEENT: TMs clear bilaterally, edematous turbinates bilaterally, oropharynx clear, no enlarged tonsils, no cervical adenopathy  Assessment:    allergic rhinitis   Plan:    Please refer to problem based charting

## 2014-05-20 ENCOUNTER — Other Ambulatory Visit: Payer: Self-pay | Admitting: *Deleted

## 2014-05-20 MED ORDER — IRBESARTAN-HYDROCHLOROTHIAZIDE 150-12.5 MG PO TABS: ORAL_TABLET | ORAL | Status: DC

## 2014-09-20 ENCOUNTER — Telehealth: Payer: Self-pay | Admitting: Oncology

## 2014-09-20 NOTE — Telephone Encounter (Signed)
S/w pt confirming labs/ov r/s due to provider schedule, mailed out schedule... KJ  °

## 2014-12-20 ENCOUNTER — Encounter: Payer: Self-pay | Admitting: Internal Medicine

## 2014-12-20 ENCOUNTER — Ambulatory Visit (INDEPENDENT_AMBULATORY_CARE_PROVIDER_SITE_OTHER): Payer: Federal, State, Local not specified - PPO | Admitting: Internal Medicine

## 2014-12-20 VITALS — BP 186/92 | HR 78 | Temp 98.4°F | Ht 62.0 in | Wt 135.2 lb

## 2014-12-20 DIAGNOSIS — I160 Hypertensive urgency: Secondary | ICD-10-CM

## 2014-12-20 DIAGNOSIS — I10 Essential (primary) hypertension: Secondary | ICD-10-CM

## 2014-12-20 MED ORDER — IRBESARTAN-HYDROCHLOROTHIAZIDE 150-12.5 MG PO TABS: 2.0000 | ORAL_TABLET | Freq: Every day | ORAL | Status: DC

## 2014-12-20 MED ORDER — AMLODIPINE BESYLATE 5 MG PO TABS: 5.0000 mg | ORAL_TABLET | Freq: Every day | ORAL | Status: DC

## 2014-12-20 MED ORDER — IRBESARTAN-HYDROCHLOROTHIAZIDE 150-12.5 MG PO TABS: ORAL_TABLET | ORAL | Status: DC

## 2014-12-20 NOTE — Patient Instructions (Addendum)
Samantha Norton,   It was very nice meeting you today!  Given how high your blood pressure was at today's visit, I would like for you to return on Monday or Tuesday for a blood pressure check with one of our nurses. Please continue taking two tablets of Avalide every day, and begin taking one tablet of Norvasc every day.  I would also like for you to come back on one month for another blood pressure follow-up appointment to see if we need to adjust your medications again. Between now and then, please measure your blood pressure daily and write down the numbers you get. Also please take note of how many times you experience chest pain.   If you have any questions or issues receiving your medications, please do not hesitate to call.   -Dr. Avon Gully     Hypertension Hypertension is another name for high blood pressure. High blood pressure forces your heart to work harder to pump blood. A blood pressure reading has two numbers, which includes a higher number over a lower number (example: 110/72). HOME CARE   Have your blood pressure rechecked by your doctor.  Only take medicine as told by your doctor. Follow the directions carefully. The medicine does not work as well if you skip doses. Skipping doses also puts you at risk for problems.  Do not smoke.  Monitor your blood pressure at home as told by your doctor. GET HELP IF:  You think you are having a reaction to the medicine you are taking.  You have repeat headaches or feel dizzy.  You have puffiness (swelling) in your ankles.  You have trouble with your vision. GET HELP RIGHT AWAY IF:   You get a very bad headache and are confused.  You feel weak, numb, or faint.  You get chest or belly (abdominal) pain.  You throw up (vomit).  You cannot breathe very well. MAKE SURE YOU:   Understand these instructions.  Will watch your condition.  Will get help right away if you are not doing well or get worse. Document Released:  09/15/2007 Document Revised: 04/03/2013 Document Reviewed: 01/19/2013 [facility] Patient Information 2015 [place], [place]. This information is not intended to replace advice given to you by your health care provider. Make sure you discuss any questions you have with your health care provider.

## 2014-12-20 NOTE — Progress Notes (Signed)
   Subjective:    Patient ID: Samantha Norton, female    DOB: [birth year], 81 y.o.   MRN: [id number]  HPI  Samantha Norton is an 81 yo F with PMH of HTN presenting for HTN follow-up.   She reports that she measures her BP at home about once a week, and normally gets readings in the high 130s/high 90s. She denies changes in vision, swelling, or difficulty breathing, but does report some chest tightness about once a month that has been occurring for the past three years. She says this pain usually occurs when she is active and resolves when she sits down. She denies any additional symptoms and says generally she feels very well.    Review of Systems  Eyes: Negative for visual disturbance.  Respiratory: Positive for chest tightness. Negative for shortness of breath.   Cardiovascular: Negative for chest pain, palpitations and leg swelling.  Gastrointestinal: Negative for abdominal pain.      Objective:   Physical Exam  Constitutional: She is oriented to person, place, and time. She appears well-developed and well-nourished. No distress.  HENT:  Head: Normocephalic and atraumatic.  Cardiovascular: Normal rate, regular rhythm and normal heart sounds.   No murmur heard. Pulmonary/Chest: Effort normal and breath sounds normal. No respiratory distress. She has no wheezes.  Abdominal: Soft. Bowel sounds are normal. She exhibits no distension. There is no tenderness.  Musculoskeletal: She exhibits no edema.  Neurological: She is alert and oriented to person, place, and time. No cranial nerve deficit.  Skin: Skin is warm and dry.  Psychiatric: She has a normal mood and affect. Her behavior is normal.  Vitals reviewed.     Assessment & Plan:   Hypertension BPs of 174/158, 192/105, and 186/92 (manual) during visit. Has been taking irbesartan-HCTZ 150-12.5 BID. Denies any symptoms other than chest tightness once a month for the past ~3 years.  - Cont irbesartan-HCTZ 150-12.5 BID - Begin Norvasc 5 mg  qd - Return in three days for BP check - Return in one month for HTN follow-up appt - If chest pain continues, consider switching Norvasc to a beta-blocker - Encouraged patient to continue to monitor BP at home and write down numbers - CMET today to assess for liver/kidney damage

## 2014-12-20 NOTE — Assessment & Plan Note (Signed)
BPs of 174/158, 192/105, and 186/92 (manual) during visit. Has been taking irbesartan-HCTZ 150-12.5 BID. Denies any symptoms other than chest tightness once a month for the past ~3 years.  - Cont irbesartan-HCTZ 150-12.5 BID - Begin Norvasc 5 mg qd - Return in three days for BP check - Return in one month for HTN follow-up appt - If chest pain continues, consider switching Norvasc to a beta-blocker - Encouraged patient to continue to monitor BP at home and write down numbers - CMET today to assess for liver/kidney damage

## 2014-12-21 LAB — COMPREHENSIVE METABOLIC PANEL
ALT: 19 U/L (ref 6–29)
AST: 22 U/L (ref 10–35)
Albumin: 4.3 g/dL (ref 3.6–5.1)
Alkaline Phosphatase: 65 U/L (ref 33–130)
BILIRUBIN TOTAL: 0.8 mg/dL (ref 0.2–1.2)
BUN: 17 mg/dL (ref 7–25)
CO2: 25 mmol/L (ref 20–31)
Calcium: 10.2 mg/dL (ref 8.6–10.4)
Chloride: 101 mmol/L (ref 98–110)
Creat: 0.87 mg/dL (ref 0.60–0.88)
GLUCOSE: 77 mg/dL (ref 65–99)
Potassium: 3.8 mmol/L (ref 3.5–5.3)
Sodium: 141 mmol/L (ref 135–146)
Total Protein: 7.4 g/dL (ref 6.1–8.1)

## 2014-12-23 ENCOUNTER — Ambulatory Visit (INDEPENDENT_AMBULATORY_CARE_PROVIDER_SITE_OTHER): Payer: Federal, State, Local not specified - PPO | Admitting: *Deleted

## 2014-12-23 VITALS — BP 150/80 | HR 74

## 2014-12-23 DIAGNOSIS — Z0130 Encounter for examination of blood pressure without abnormal findings: Secondary | ICD-10-CM

## 2014-12-23 DIAGNOSIS — Z136 Encounter for screening for cardiovascular disorders: Secondary | ICD-10-CM

## 2014-12-23 DIAGNOSIS — I10 Essential (primary) hypertension: Secondary | ICD-10-CM

## 2014-12-23 NOTE — Progress Notes (Signed)
   Patient in nurse clinic for blood pressure check.  BP 160/90 manually, heart rate 74.  Patient denies any symptoms at this time. Blood pressure recheck 150/80.  Patient stated she is taking her medications as prescribed.  Will forward to PCP.  Derl Barrow, RN

## 2015-01-20 ENCOUNTER — Other Ambulatory Visit: Payer: Self-pay | Admitting: Obstetrics & Gynecology

## 2015-01-20 DIAGNOSIS — N6320 Unspecified lump in the left breast, unspecified quadrant: Secondary | ICD-10-CM

## 2015-01-20 DIAGNOSIS — N6310 Unspecified lump in the right breast, unspecified quadrant: Secondary | ICD-10-CM

## 2015-01-27 ENCOUNTER — Ambulatory Visit
Admission: RE | Admit: 2015-01-27 | Discharge: 2015-01-27 | Disposition: A | Discharge status: Home / Self Care | Payer: Federal, State, Local not specified - PPO | Source: Ambulatory Visit | Attending: Obstetrics & Gynecology | Admitting: Obstetrics & Gynecology

## 2015-01-27 DIAGNOSIS — N6310 Unspecified lump in the right breast, unspecified quadrant: Secondary | ICD-10-CM

## 2015-01-27 DIAGNOSIS — N6320 Unspecified lump in the left breast, unspecified quadrant: Secondary | ICD-10-CM

## 2015-01-31 ENCOUNTER — Ambulatory Visit (INDEPENDENT_AMBULATORY_CARE_PROVIDER_SITE_OTHER): Payer: Federal, State, Local not specified - PPO | Admitting: Internal Medicine

## 2015-01-31 ENCOUNTER — Encounter: Payer: Self-pay | Admitting: Internal Medicine

## 2015-01-31 VITALS — BP 150/102 | HR 63 | Temp 97.9°F | Ht 62.0 in | Wt 131.8 lb

## 2015-01-31 DIAGNOSIS — Z0000 Encounter for general adult medical examination without abnormal findings: Secondary | ICD-10-CM

## 2015-01-31 DIAGNOSIS — Z23 Encounter for immunization: Secondary | ICD-10-CM | POA: Diagnosis not present

## 2015-01-31 DIAGNOSIS — I10 Essential (primary) hypertension: Secondary | ICD-10-CM

## 2015-01-31 DIAGNOSIS — R7303 Prediabetes: Secondary | ICD-10-CM

## 2015-01-31 DIAGNOSIS — Z7189 Other specified counseling: Secondary | ICD-10-CM

## 2015-01-31 LAB — POCT GLYCOSYLATED HEMOGLOBIN (HGB A1C): HEMOGLOBIN A1C: 6.5

## 2015-01-31 NOTE — Patient Instructions (Signed)
It was nice to see you today!  It is VERY important to keep track of your blood pressure readings at home. This will help determine if your blood pressure medications are working. Please write down the numbers EVERY time you record your blood pressure, and bring those records with you at your follow-up appointment. Also, please bring the blood pressure monitor you use at home to your next appointment, as well as all of your medications.   I also think it would be very helpful for you to purchase a weekly pill box for your medications so it will be easy to keep track of them.   I've included information below at the two vaccines we discussed today (Pneumovax and Zostavax). Please consider getting these vaccines at your next appointment.   If you have any questions between now and your next appointment, please do not hesitate to call.   Be well, Dr. Kizzie Fantasia Virus Vaccine Live injection What is this medicine? VARICELLA VIRUS VACCINE (var uh SEL uh VAHY ruhs vak SEEN) is used to prevent infections of chickenpox. HERPES ZOSTER VIRUS VACCINE (HUR peez ZOS ter vahy ruhs vak SEEN) is used to prevent shingles in adults 80 years old and over. This vaccine is not used to treat shingles or nerve pain from shingles. These medicines may be used for other purposes; ask your health care provider or pharmacist if you have questions. This medicine may be used for other purposes; ask your health care provider or pharmacist if you have questions. What should I tell my health care provider before I take this medicine? They need to know if you have any of the following conditions: -blood disorders or disease -cancer like leukemia or lymphoma -immune system problems or therapy -infection with fever -recent immune globulin therapy -tuberculosis -an unusual or allergic reaction to vaccines, neomycin, gelatin, other medicines, foods, dyes, or preservatives -pregnant or trying to get  pregnant -breast-feeding How should I use this medicine? These vaccines are for injection under the skin. They are given by a health care professional. A copy of Vaccine Information Statements will be given before each varicella virus vaccination. Read this sheet carefully each time. The sheet may change frequently. A Vaccine Information Statement is not given before the herpes zoster virus vaccine. Talk to your pediatrician regarding the use of the varicella virus vaccine in children. While this drug may be prescribed for children as young as 61 months of age for selected conditions, precautions do apply. The herpes zoster virus vaccine is not approved in children. Overdosage: If you think you have taken too much of this medicine contact a poison control center or emergency room at once. NOTE: This medicine is only for you. Do not share this medicine with others. What if I miss a dose? Keep appointments for follow-up (booster) doses of varicella virus vaccine as directed. It is important not to miss your dose. Call your doctor or health care professional if you are unable to keep an appointment. Follow-up (booster) doses are not needed for the herpes zoster virus vaccine. What may interact with this medicine? Do not take these medicines with any of the following medications: -adalimumab -anakinra -etanercept -infliximab -medicines that suppress your immune system -medicines to treat cancer These medicines may also interact with the following medications: -aspirin and aspirin-like medicines (varicella virus vaccine only) -blood transfusions (varicella virus vaccine only) -immunoglobulins (varicella virus vaccine only) -steroid medicines like prednisone or cortisone This list may not describe all possible interactions. Give your  health care provider a list of all the medicines, herbs, non-prescription drugs, or dietary supplements you use. Also tell them if you smoke, drink alcohol, or use  illegal drugs. Some items may interact with your medicine. What should I watch for while using this medicine? Visit your doctor for regular check ups. These vaccines, like all vaccines, may not fully protect everyone. After receiving these vaccines it may be possible to pass chickenpox infection to others. For up to 6 weeks, avoid people with immune system problems, pregnant women who have not had chickenpox, newborns of women who have not had chickenpox, and all newborns born at less than 28 weeks of pregnancy. Talk to your doctor for more information. Do not become pregnant for 3 months after taking these vaccines. Women should inform their doctor if they wish to become pregnant or think they might be pregnant. There is a potential for serious side effects to an unborn child. Talk to your health care professional or pharmacist for more information. What side effects may I notice from receiving this medicine? Side effects that you should report to your doctor or health care professional as soon as possible: -allergic reactions like skin rash, itching or hives, swelling of the face, lips, or tongue -breathing problems -extreme changes in behavior -feeling faint or lightheaded, falls -fever over 102 degrees F -pain, tingling, numbness in the hands or feet -redness, blistering, peeling or loosening of the skin, including inside the mouth -seizures -unusually weak or tired Side effects that usually do not require medical attention (report to your doctor or health care professional if they continue or are bothersome): -aches or pains -chickenpox-like rash -diarrhea -headache -low-grade fever under 102 degrees F -loss of appetite -nausea, vomiting -redness, pain, swelling at site where injected -sleepy -trouble sleeping This list may not describe all possible side effects. Call your doctor for medical advice about side effects. You may report side effects to FDA at 1-800-FDA-1088. Where  should I keep my medicine? These drugs are given in a hospital or clinic and will not be stored at home. NOTE: This sheet is a summary. It may not cover all possible information. If you have questions about this medicine, talk to your doctor, pharmacist, or health care provider.    2016, Elsevier/Gold Standard. (2012-12-01 14:24:35) Pneumococcal Vaccine, Polyvalent solution for injection What is this medicine? PNEUMOCOCCAL VACCINE, POLYVALENT (NEU mo KOK al vak SEEN, pol ee VEY luhnt) is a vaccine to prevent pneumococcus bacteria infection. These bacteria are a major cause of ear infections, Strep throat infections, and serious pneumonia, meningitis, or blood infections worldwide. These vaccines help the body to produce antibodies (protective substances) that help your body defend against these bacteria. This vaccine is recommended for people 15 years of age and older with health problems. It is also recommended for all adults over 14 years old. This vaccine will not treat an infection. This medicine may be used for other purposes; ask your health care provider or pharmacist if you have questions. What should I tell my health care provider before I take this medicine? They need to know if you have any of these conditions: -bleeding problems -bone marrow or organ transplant -cancer, Hodgkin's disease -fever -infection -immune system problems -low platelet count in the blood -seizures -an unusual or allergic reaction to pneumococcal vaccine, diphtheria toxoid, other vaccines, latex, other medicines, foods, dyes, or preservatives -pregnant or trying to get pregnant -breast-feeding How should I use this medicine? This vaccine is for injection into a muscle  or under the skin. It is given by a health care professional. A copy of Vaccine Information Statements will be given before each vaccination. Read this sheet carefully each time. The sheet may change frequently. Talk to your pediatrician  regarding the use of this medicine in children. While this drug may be prescribed for children as young as 49 years of age for selected conditions, precautions do apply. Overdosage: If you think you have taken too much of this medicine contact a poison control center or emergency room at once. NOTE: This medicine is only for you. Do not share this medicine with others. What if I miss a dose? It is important not to miss your dose. Call your doctor or health care professional if you are unable to keep an appointment. What may interact with this medicine? -medicines for cancer chemotherapy -medicines that suppress your immune function -medicines that treat or prevent blood clots like warfarin, enoxaparin, and dalteparin -steroid medicines like prednisone or cortisone This list may not describe all possible interactions. Give your health care provider a list of all the medicines, herbs, non-prescription drugs, or dietary supplements you use. Also tell them if you smoke, drink alcohol, or use illegal drugs. Some items may interact with your medicine. What should I watch for while using this medicine? Mild fever and pain should go away in 3 days or less. Report any unusual symptoms to your doctor or health care professional. What side effects may I notice from receiving this medicine? Side effects that you should report to your doctor or health care professional as soon as possible: -allergic reactions like skin rash, itching or hives, swelling of the face, lips, or tongue -breathing problems -confused -fever over 102 degrees F -pain, tingling, numbness in the hands or feet -seizures -unusual bleeding or bruising -unusual muscle weakness Side effects that usually do not require medical attention (report to your doctor or health care professional if they continue or are bothersome): -aches and pains -diarrhea -fever of 102 degrees F or less -headache -irritable -loss of appetite -pain, tender  at site where injected -trouble sleeping This list may not describe all possible side effects. Call your doctor for medical advice about side effects. You may report side effects to FDA at 1-800-FDA-1088. Where should I keep my medicine? This does not apply. This vaccine is given in a clinic, pharmacy, doctor's office, or other health care setting and will not be stored at home. NOTE: This sheet is a summary. It may not cover all possible information. If you have questions about this medicine, talk to your doctor, pharmacist, or health care provider.    2016, Elsevier/Gold Standard. (2007-11-03 14:32:37)

## 2015-01-31 NOTE — Assessment & Plan Note (Signed)
Reviewed patient's records and determined need for Pneumovax and Zostavax. Patient willing to receive flu shot today, but does not want other vaccines at this time.  - Discussed importance of Pneumovax and Zostavax for a patient of her age - Provided information regarding both vaccines - Will encourage patient to receive vaccines at follow-up

## 2015-01-31 NOTE — Assessment & Plan Note (Addendum)
Type II DM listed as problem in patient's chart, however she is unaware of this. Last two A1Cs (from 2014 and 2013) were both 6.2.  - Will check A1C today and review with patient at one month follow-up

## 2015-01-31 NOTE — Assessment & Plan Note (Signed)
BP increased upon arrival to appt today (150/102), and was 166/92 on repeat ~30 minutes after initial measurement. She does not want any changes made to her current medication regimen, and feels that her BP is only elevated because she is at the doctor's office.  - Continue current med regimen - Norvasc 5 mg qd, irbesartan-HCTZ 150-12.5 mg BID - Follow-up in one month for BP recheck

## 2015-01-31 NOTE — Progress Notes (Signed)
   Subjective:    Patient ID: Samantha Norton, female    DOB: Aug 24, 1931, 81 y.o.   MRN: [id number]  HPI  Samantha Norton is an 81 yo F with PMH of HTN, Type II DM, and HLD presenting for a HTN follow-up.   At patient's last visit one month ago, she was found to have hypertensive urgency. Her BP had normalized at a BP check two days later. Today her BP continues to be elevated at 150/102 on arrival.   She reports that she takes her medication almost every day, and may miss one dose every couple weeks. She says she measured her blood pressure at home every few days. Readings are normally systolics in the 127N-170Y with diastolics in the 17C. She says her blood pressure is always higher when she is at the doctor's office.   She also reports what she believes is heartburn after eating dinner. It usually occurs when she is reclining or lying down, and improves when she stands up. She has never taken anything to alleviate her symptoms. She denies the chest tightness that she reported at our last visit.    Review of Systems  Eyes: Negative for visual disturbance.  Respiratory: Negative for chest tightness and shortness of breath.   Cardiovascular: Positive for chest pain. Negative for palpitations and leg swelling.  Gastrointestinal: Negative for abdominal pain.  Neurological: Negative for dizziness, weakness, light-headedness and numbness.       Objective:   Physical Exam  Constitutional: She is oriented to person, place, and time. She appears well-developed and well-nourished. No distress.  HENT:  Head: Normocephalic and atraumatic.  Cardiovascular: Normal rate, regular rhythm and normal heart sounds.   No murmur heard. Pulmonary/Chest: Effort normal and breath sounds normal. No respiratory distress. She has no wheezes.  Abdominal: Soft. Bowel sounds are normal. She exhibits no distension. There is no tenderness.  Musculoskeletal: Normal range of motion. She exhibits no edema or tenderness.    Neurological: She is alert and oriented to person, place, and time. No cranial nerve deficit.  Skin: Skin is warm and dry.  Psychiatric: She has a normal mood and affect. Her behavior is normal.  Vitals reviewed.      Assessment & Plan:   Samantha Norton is an 81 yo F with PMH of HTN, HLD, and Type II DM presenting for BP follow-up.  Hypertension BP increased upon arrival to appt today (150/102), and was 166/92 on repeat ~30 minutes after initial measurement. She does not want any changes made to her current medication regimen, and feels that her BP is only elevated because she is at the doctor's office.  - Continue current med regimen - Norvasc 5 mg qd, irbesartan-HCTZ 150-12.5 mg BID - Follow-up in one month for BP recheck  Health care maintenance Reviewed patient's records and determined need for Pneumovax and Zostavax. Patient willing to receive flu shot today, but does not want other vaccines at this time.  - Discussed importance of Pneumovax and Zostavax for a patient of her age - Provided information regarding both vaccines - Will encourage patient to receive vaccines at follow-up   Borderline type 2 diabetes mellitus Type II DM listed as problem in patient's chart, however she is unaware of this. Last two A1Cs (from 2014 and 2013) were both 6.2.  - Will check A1C today and review with patient at one month follow-up

## 2015-02-11 ENCOUNTER — Other Ambulatory Visit: Payer: Medicare Other

## 2015-02-11 ENCOUNTER — Ambulatory Visit: Payer: Medicare Other | Admitting: Oncology

## 2015-02-13 ENCOUNTER — Telehealth: Payer: Self-pay | Admitting: Oncology

## 2015-02-13 NOTE — Telephone Encounter (Signed)
RETURNED CALL TO PATIENT RE R/S 11/1 APPOINTMENTS GAVE PATIENT NEW APPOINTMENTS FOR 12/13.

## 2015-03-03 ENCOUNTER — Encounter: Payer: Self-pay | Admitting: Internal Medicine

## 2015-03-03 ENCOUNTER — Ambulatory Visit (INDEPENDENT_AMBULATORY_CARE_PROVIDER_SITE_OTHER): Payer: Federal, State, Local not specified - PPO | Admitting: Internal Medicine

## 2015-03-03 VITALS — BP 158/75 | HR 72 | Temp 97.6°F | Ht 62.0 in | Wt 132.0 lb

## 2015-03-03 DIAGNOSIS — Z23 Encounter for immunization: Secondary | ICD-10-CM | POA: Diagnosis not present

## 2015-03-03 DIAGNOSIS — I10 Essential (primary) hypertension: Secondary | ICD-10-CM

## 2015-03-03 NOTE — Progress Notes (Signed)
   Subjective:    Patient ID: Samantha Norton, female    DOB: 08-09-1931, 81 y.o.   MRN: [id number]  HPI  Lockie Nygren is an 81 yo F with PMH of HTN and HLD presenting for HTN follow-up.   Ms. Evangelista reports no concerns today. She denies HA, dizziness, or changes in vision. She was seen by an optometrist last week and was told she had no issues with her eyes. She has been checking her blood pressure at home, and brought a list of ~15 BP measurements from home. Average BP at home was 130/80 (highest 142/91, lowest 119/85). She does not measure her BP at any particular time of day.   Patient is former smoker.   Review of Systems See HPI.    Objective:   Physical Exam  Constitutional: She is oriented to person, place, and time. She appears well-developed and well-nourished. No distress.  HENT:  Head: Normocephalic and atraumatic.  Cardiovascular: Normal rate, regular rhythm and normal heart sounds.   No murmur heard. Pulmonary/Chest: Effort normal and breath sounds normal. No respiratory distress.  Neurological: She is alert and oriented to person, place, and time.  Psychiatric: She has a normal mood and affect. Her behavior is normal.  Vitals reviewed.     Assessment & Plan:  Hypertension Stable. Patient brought home BP monitor, which measured BP similar to that measured in office today. Since patient's BP measurements from home average 130/80 and patient's home BP monitor does not seem to provide artificially low measurements, will not make medication changes at this time. - Follow-up in 6 months    Adin Hector, MD PGY-1 [facility]

## 2015-03-03 NOTE — Patient Instructions (Addendum)
It was nice seeing you again today, Ms. Bambrick!  Please continue to take your current medications as you were.   I will see you back in six months to follow up on your blood pressure. If you have any questions or concerns in the meantime, please feel free to call the office.   Be well,  Dr. Avon Gully

## 2015-03-03 NOTE — Assessment & Plan Note (Signed)
Stable. Patient brought home BP monitor, which measured BP similar to that measured in office today. Since patient's BP measurements from home average 130/80 and patient's home BP monitor does not seem to provide artificially low measurements, will not make medication changes at this time. - Follow-up in 6 months

## 2015-03-24 ENCOUNTER — Other Ambulatory Visit: Payer: Self-pay | Admitting: *Deleted

## 2015-03-24 DIAGNOSIS — D649 Anemia, unspecified: Secondary | ICD-10-CM

## 2015-03-25 ENCOUNTER — Other Ambulatory Visit (HOSPITAL_BASED_OUTPATIENT_CLINIC_OR_DEPARTMENT_OTHER): Payer: Federal, State, Local not specified - PPO

## 2015-03-25 ENCOUNTER — Telehealth: Payer: Self-pay | Admitting: Oncology

## 2015-03-25 ENCOUNTER — Ambulatory Visit (HOSPITAL_BASED_OUTPATIENT_CLINIC_OR_DEPARTMENT_OTHER): Payer: Federal, State, Local not specified - PPO | Admitting: Oncology

## 2015-03-25 VITALS — BP 182/92 | HR 74 | Temp 98.1°F | Resp 18 | Ht 62.0 in | Wt 133.3 lb

## 2015-03-25 DIAGNOSIS — D696 Thrombocytopenia, unspecified: Secondary | ICD-10-CM

## 2015-03-25 DIAGNOSIS — D649 Anemia, unspecified: Secondary | ICD-10-CM

## 2015-03-25 LAB — COMPREHENSIVE METABOLIC PANEL
ALBUMIN: 3.8 g/dL (ref 3.5–5.0)
ALK PHOS: 60 U/L (ref 40–150)
ALT: 22 U/L (ref 0–55)
AST: 24 U/L (ref 5–34)
Anion Gap: 7 mEq/L (ref 3–11)
BUN: 14.6 mg/dL (ref 7.0–26.0)
CHLORIDE: 105 meq/L (ref 98–109)
CO2: 27 mEq/L (ref 22–29)
Calcium: 9.8 mg/dL (ref 8.4–10.4)
Creatinine: 1.1 mg/dL (ref 0.6–1.1)
EGFR: 53 mL/min/{1.73_m2} — AB (ref >90)
GLUCOSE: 185 mg/dL — AB (ref 70–140)
POTASSIUM: 3.9 meq/L (ref 3.5–5.1)
SODIUM: 139 meq/L (ref 136–145)
Total Bilirubin: 0.82 mg/dL (ref 0.20–1.20)
Total Protein: 7.4 g/dL (ref 6.4–8.3)

## 2015-03-25 LAB — CBC WITH DIFFERENTIAL/PLATELET
BASO%: 2.9 % — ABNORMAL HIGH (ref 0.0–2.0)
BASOS ABS: 0.1 10*3/uL (ref 0.0–0.1)
EOS ABS: 0.2 10*3/uL (ref 0.0–0.5)
EOS%: 5.9 % (ref 0.0–7.0)
HCT: 39.1 % (ref 34.8–46.6)
HEMOGLOBIN: 12.4 g/dL (ref 11.6–15.9)
LYMPH%: 38 % (ref 14.0–49.7)
MCH: 28.5 pg (ref 25.1–34.0)
MCHC: 31.8 g/dL (ref 31.5–36.0)
MCV: 89.7 fL (ref 79.5–101.0)
MONO#: 0.2 10*3/uL (ref 0.1–0.9)
MONO%: 6.1 % (ref 0.0–14.0)
NEUT#: 1.7 10*3/uL (ref 1.5–6.5)
NEUT%: 47.1 % (ref 38.4–76.8)
Platelets: 128 10*3/uL — ABNORMAL LOW (ref 145–400)
RBC: 4.36 10*6/uL (ref 3.70–5.45)
RDW: 13.9 % (ref 11.2–14.5)
WBC: 3.6 10*3/uL — ABNORMAL LOW (ref 3.9–10.3)
lymph#: 1.4 10*3/uL (ref 0.9–3.3)

## 2015-03-25 NOTE — Progress Notes (Signed)
Hematology and Oncology Follow Up Visit  Samantha Norton [id number] Apr 10, 1932 81 y.o. 03/25/2015 1:32 PM     Principle Diagnosis: This is a 81 year old female initially diagnosed with normocytic normochromic anemia and mild thrombocytopenia diagnosed in 2010.   Current therapy: Observation and surveillance.    Interim History:  Samantha Norton presents today for a followup visit. Since the last visit, she continues to do very well. She has not reported any recent illnesses or hospitalizations. She did report mild nosebleed related to dry mucous membranes and have been seen by ENT. She has not had any other bleeding episodes such as hematochezia or melena. Has not reported any petechiae or easy bruisability. She has not reported any constitutional symptoms of fevers or chills or sweats. She continues to live independently and attends to activities of daily living.  She has not reported any headaches or change in her mental status.She has not reported any chest pain, shortness of breath or difficulty breathing. She has not reported any cough or hemoptysis. Does not report any nausea, vomiting or abdominal pain. She has not reported any frequency urgency or hesitancy. Rest of her review of systems unremarkable.  Medications: I have reviewed the patient's current medications.   Current Outpatient Prescriptions  Medication Sig Dispense Refill  . amLODipine (NORVASC) 5 MG tablet Take 1 tablet (5 mg total) by mouth daily. 90 tablet 3  . aspirin 81 MG tablet Take 81 mg by mouth daily.      . calcium-vitamin D (OSCAL WITH D) 250-125 MG-UNIT per tablet Take 1 tablet by mouth daily.      Marland Kitchen FLUVIRIN PRESERVATIVE FREE SUSP injection     . irbesartan-hydrochlorothiazide (AVALIDE) 150-12.5 MG per tablet TAKE 2 TABLETS DAILY 180 tablet 4  . mometasone (NASONEX) 50 MCG/ACT nasal spray Place 2 sprays into the nose daily. Two sprays into EACH nostril once daily 17 g 5  . multivitamin (THERAGRAN) per tablet Take 1  tablet by mouth daily.      Marland Kitchen omega-3 acid ethyl esters (LOVAZA) 1 G capsule Take 1 g by mouth daily.      Marland Kitchen PATADAY 0.2 % SOLN      No current facility-administered medications for this visit.    Allergies: No Known Allergies  Past Medical History, Surgical history, Social history, and Family History were reviewed and updated.  Physical Exam: Blood pressure 182/92, pulse 74, temperature 98.1 F (36.7 C), temperature source Oral, resp. rate 18, height 5\' 2"  (1.575 m), weight 133 lb 4.8 oz (60.464 kg), SpO2 100 %. ECOG: 1 General appearance: alert awake woman without distress. Head: Normocephalic, without obvious abnormality no oral ulcers or lesions. Neck: no adenopathy Lymph nodes: Cervical, supraclavicular, and axillary nodes normal. Heart:regular rate and rhythm, S1, S2 normal, no murmur, click, rub or gallop Lung:chest clear, no wheezing, rales, normal symmetric air entry. Abdomin: soft, non-tender, without masses or organomegaly no shifting dullness or ascites. EXT:no erythema, induration, or nodules Skin: no rash, no petechia    Lab Results: Lab Results  Component Value Date   WBC 3.6* 03/25/2015   HGB 12.4 03/25/2015   HCT 39.1 03/25/2015   MCV 89.7 03/25/2015   PLT 128* 03/25/2015     Chemistry      Component Value Date/Time   NA 141 12/20/2014 1425   NA 141 02/12/2014 1303   K 3.8 12/20/2014 1425   K 3.9 02/12/2014 1303   CL 101 12/20/2014 1425   CL 107 02/11/2012 1441   CO2 25 12/20/2014  1425   CO2 26 02/12/2014 1303   BUN 17 12/20/2014 1425   BUN 18.5 02/12/2014 1303   CREATININE 0.87 12/20/2014 1425   CREATININE 1.2* 02/12/2014 1303   CREATININE 0.93 03/07/2013 1100      Component Value Date/Time   CALCIUM 10.2 12/20/2014 1425   CALCIUM 9.8 02/12/2014 1303   ALKPHOS 65 12/20/2014 1425   ALKPHOS 57 02/12/2014 1303   AST 22 12/20/2014 1425   AST 21 02/12/2014 1303   ALT 19 12/20/2014 1425   ALT 16 02/12/2014 1303   BILITOT 0.8 12/20/2014 1425    BILITOT 0.91 02/12/2014 1303       Impression and Plan:  This is a 81 year old female with the following issues. 1. Anemia. Normochromic normocytic in the past, but have been really normal in the last 5 years.  I do not see any evidence of any vitamin deficiency.  No intervention is needed. 2. Mild thrombocytopenia.  Platelet counts have been close 100,000.  Differential diagnosis include medication versus autoimmune phenomenon such as idiopathic thrombocytopenic purpura. Platelet count today is close to the normal range without any major bleeding episodes. Her epistaxis is less likely related to her platelet count. 3.     Follow-up: Will be in 12 months. Sooner if needed.    [facility], MD 12/13/20161:32 PM

## 2015-03-25 NOTE — Telephone Encounter (Signed)
Gave and printed appt sched and avs for pt for DEC 2017 °

## 2015-07-08 ENCOUNTER — Other Ambulatory Visit: Payer: Self-pay | Admitting: Obstetrics & Gynecology

## 2015-07-08 DIAGNOSIS — N63 Unspecified lump in unspecified breast: Secondary | ICD-10-CM

## 2015-07-24 ENCOUNTER — Ambulatory Visit
Admission: RE | Admit: 2015-07-24 | Discharge: 2015-07-24 | Disposition: A | Discharge status: Home / Self Care | Payer: Federal, State, Local not specified - PPO | Source: Ambulatory Visit | Attending: Obstetrics & Gynecology | Admitting: Obstetrics & Gynecology

## 2015-07-24 DIAGNOSIS — N63 Unspecified lump in unspecified breast: Secondary | ICD-10-CM

## 2016-01-12 ENCOUNTER — Encounter: Payer: Self-pay | Admitting: Internal Medicine

## 2016-01-12 ENCOUNTER — Ambulatory Visit (INDEPENDENT_AMBULATORY_CARE_PROVIDER_SITE_OTHER): Payer: Federal, State, Local not specified - PPO | Admitting: Internal Medicine

## 2016-01-12 VITALS — BP 140/80 | HR 81 | Temp 98.3°F | Wt 127.0 lb

## 2016-01-12 DIAGNOSIS — Z23 Encounter for immunization: Secondary | ICD-10-CM | POA: Diagnosis not present

## 2016-01-12 DIAGNOSIS — I10 Essential (primary) hypertension: Secondary | ICD-10-CM

## 2016-01-12 DIAGNOSIS — R5383 Other fatigue: Secondary | ICD-10-CM | POA: Diagnosis not present

## 2016-01-12 MED ORDER — CALCIUM CARBONATE-VITAMIN D 250-125 MG-UNIT PO TABS: 1.0000 | ORAL_TABLET | Freq: Every day | ORAL | 11 refills | Status: DC

## 2016-01-12 MED ORDER — OLOPATADINE HCL 0.2 % OP SOLN
1.0000 [drp] | Freq: Every day | OPHTHALMIC | 11 refills | Status: AC | PRN
Start: 2016-01-12 — End: ?

## 2016-01-12 MED ORDER — IRBESARTAN-HYDROCHLOROTHIAZIDE 150-12.5 MG PO TABS: ORAL_TABLET | ORAL | 4 refills | Status: DC

## 2016-01-12 NOTE — Assessment & Plan Note (Signed)
BP stable for patient's age in 140s/90s. As patient not compliant with amlodipine, will discontinue.  - Discontinue amlodipine - Continue Avalide

## 2016-01-12 NOTE — Assessment & Plan Note (Signed)
Etiology unknown. Could be 2/2 hypothyroidism as patient also reporting dry skin and dry eyes.  - TSH, free T4 today - Will discuss possibly beginning Synthroid with patient if lab results are abnormal

## 2016-01-12 NOTE — Progress Notes (Signed)
   Subjective:    Patient ID: Samantha Norton, female    DOB: 1931-09-10, 82 y.o.   MRN: [id number]  HPI  Patient presents with complaint of fatigue and for HTN f/u.  HTN Currently only taking Avalide. Stopped taking amlodipine because it "made her ears roar." Reports BP at home usually 140/90s. Denies HA, vision changes.   Fatigue Recently with much less energy than usual. Sleeping habits have not changed. Also reports dry skin and dry eyes (though does have history of dry eyes for which she uses Pataday solution).   Review of Systems See HPI.    Objective:   Physical Exam  Constitutional: She is oriented to person, place, and time. She appears well-developed and well-nourished. No distress.  HENT:  Head: Normocephalic and atraumatic.  Eyes: Conjunctivae and EOM are normal. Right eye exhibits no discharge. Left eye exhibits no discharge.  Pulmonary/Chest: Effort normal. No respiratory distress.  Neurological: She is alert and oriented to person, place, and time.  Skin: Skin is warm and dry.  Psychiatric: She has a normal mood and affect. Her behavior is normal.  Vitals reviewed.     Assessment & Plan:  Hypertension BP stable for patient's age in 140s/90s. As patient not compliant with amlodipine, will discontinue.  - Discontinue amlodipine - Continue Avalide  Fatigue Etiology unknown. Could be 2/2 hypothyroidism as patient also reporting dry skin and dry eyes.  - TSH, free T4 today - Will discuss possibly beginning Synthroid with patient if lab results are abnormal  Adin Hector, MD, MPH PGY-2 [facility] Pager [phone]

## 2016-01-12 NOTE — Patient Instructions (Signed)
It was nice seeing you again today Ms. Cisse!  I have refilled your medications today.   We will let you know the results of your thyroid hormone testing, and we can discuss beginning medication if necessary depending on the results.   If you have any questions or concerns, please feel free to call the clinic.   Be well,  Dr. Avon Gully

## 2016-01-13 ENCOUNTER — Encounter: Payer: Self-pay | Admitting: Internal Medicine

## 2016-01-13 LAB — TSH: TSH: 0.97 m[IU]/L

## 2016-01-13 LAB — T4, FREE: FREE T4: 1.4 ng/dL (ref 0.8–1.8)

## 2016-03-01 ENCOUNTER — Ambulatory Visit (INDEPENDENT_AMBULATORY_CARE_PROVIDER_SITE_OTHER): Payer: Federal, State, Local not specified - PPO | Admitting: Family Medicine

## 2016-03-01 ENCOUNTER — Ambulatory Visit (HOSPITAL_COMMUNITY)
Admission: RE | Admit: 2016-03-01 | Discharge: 2016-03-01 | Disposition: A | Discharge status: Home / Self Care | Payer: Federal, State, Local not specified - PPO | Source: Ambulatory Visit | Attending: Neonatal | Admitting: Neonatal

## 2016-03-01 ENCOUNTER — Other Ambulatory Visit (HOSPITAL_COMMUNITY): Payer: Federal, State, Local not specified - PPO

## 2016-03-01 ENCOUNTER — Ambulatory Visit (HOSPITAL_COMMUNITY)
Admission: RE | Admit: 2016-03-01 | Discharge: 2016-03-01 | Disposition: A | Discharge status: Home / Self Care | Payer: Federal, State, Local not specified - PPO | Source: Ambulatory Visit | Attending: Family Medicine | Admitting: Family Medicine

## 2016-03-01 ENCOUNTER — Encounter: Payer: Self-pay | Admitting: Family Medicine

## 2016-03-01 VITALS — BP 154/89 | HR 86 | Temp 97.6°F | Ht 62.0 in | Wt 120.8 lb

## 2016-03-01 DIAGNOSIS — R009 Unspecified abnormalities of heart beat: Secondary | ICD-10-CM | POA: Diagnosis not present

## 2016-03-01 DIAGNOSIS — R9431 Abnormal electrocardiogram [ECG] [EKG]: Secondary | ICD-10-CM | POA: Diagnosis not present

## 2016-03-01 DIAGNOSIS — I499 Cardiac arrhythmia, unspecified: Secondary | ICD-10-CM | POA: Diagnosis not present

## 2016-03-01 DIAGNOSIS — R001 Bradycardia, unspecified: Secondary | ICD-10-CM | POA: Diagnosis not present

## 2016-03-01 DIAGNOSIS — R7303 Prediabetes: Secondary | ICD-10-CM

## 2016-03-01 DIAGNOSIS — E119 Type 2 diabetes mellitus without complications: Secondary | ICD-10-CM | POA: Diagnosis not present

## 2016-03-01 LAB — COMPREHENSIVE METABOLIC PANEL
ALBUMIN: 4.2 g/dL (ref 3.6–5.1)
ALT: 17 U/L (ref 6–29)
AST: 16 U/L (ref 10–35)
Alkaline Phosphatase: 58 U/L (ref 33–130)
BILIRUBIN TOTAL: 1 mg/dL (ref 0.2–1.2)
BUN: 23 mg/dL (ref 7–25)
CHLORIDE: 92 mmol/L — AB (ref 98–110)
CO2: 28 mmol/L (ref 20–31)
CREATININE: 1.17 mg/dL — AB (ref 0.60–0.88)
Calcium: 10.1 mg/dL (ref 8.6–10.4)
Glucose, Bld: 671 mg/dL (ref 65–99)
Potassium: 3.9 mmol/L (ref 3.5–5.3)
SODIUM: 130 mmol/L — AB (ref 135–146)
TOTAL PROTEIN: 7.3 g/dL (ref 6.1–8.1)

## 2016-03-01 LAB — POCT URINALYSIS DIPSTICK
BILIRUBIN UA: NEGATIVE
Leukocytes, UA: NEGATIVE
Nitrite, UA: NEGATIVE
Protein, UA: NEGATIVE
RBC UA: NEGATIVE
UROBILINOGEN UA: 0.2
pH, UA: 5.5

## 2016-03-01 LAB — POCT UA - MICROSCOPIC ONLY

## 2016-03-01 LAB — POCT GLYCOSYLATED HEMOGLOBIN (HGB A1C): Hemoglobin A1C: 14.3

## 2016-03-01 MED ORDER — BLOOD GLUCOSE MONITOR KIT: PACK | 0 refills | Status: DC

## 2016-03-01 MED ORDER — INSULIN GLARGINE 100 UNIT/ML SOLOSTAR PEN: 10.0000 [IU] | PEN_INJECTOR | Freq: Every day | SUBCUTANEOUS | 11 refills | Status: DC

## 2016-03-01 NOTE — Progress Notes (Addendum)
Subjective:     Patient ID: Samantha Norton, female   DOB: Dec 11, 1931, 82 y.o.   MRN: [id number]  HPI Mrs. Karwoski is an 82yo female presenting today for sugar in urine. - Reports she was at the Ob/Gyn when a urinalysis showed sugar in her urine. She was encouraged to follow up for further evaluation. - Reports some increased thirst and urination - Has never been on insulin.  - Reports she has a glucometer at home from many years ago. Does not remember why she has it. Unsure if she can find it and requests prescription for a new one.  - Would like time to think about diagnosis before starting new medications - Denies abdominal pain, chest pain, palpitations - History of Hypertension noted. Currently on Irbesartan-HCTZ - Weight loss noted. Weight 135 in 02/2014, 132 in 02/2015, and 120pounds today.  - Nonsmoker  Review of Systems Per HPI    Objective:   Physical Exam  Constitutional: She appears well-developed and well-nourished. No distress.  Cardiovascular: Normal rate.   Irregular murmur. Systolic murmur noted.  Pulmonary/Chest: Effort normal. No respiratory distress. She has no wheezes.  Abdominal: She exhibits no distension. There is no tenderness.  Musculoskeletal: She exhibits no edema.  Skin: No rash noted.  Psychiatric: She has a normal mood and affect. Her behavior is normal.   EKG with PACs, left axis deviation, consistent with prior.    Assessment and Plan:     Diabetes ([place]) - Diagnosed today with A1C 14.3. Note last A1C 6.5 in 01/2015. - Will obtain CMP - Discussed initiating insulin today. Prescription for Lantus 10units given. Does not feel comfortable starting today and refuses first dose here in clinic today. Feeling overwhelmed with new diagnosis and would like to think about everything tonight and consider starting tomorrow. Discussed some risk of hospitalization with untreated diabetes and she acknowledges this risk.  - Urinalysis with 1+ bacteria, >1000 glucose,  trace ketones - Prescription for Glucometer given - To follow up with Pharmacy tomorrow for diabetes education. To bring Glucometer and new Lantus pens to this visit. Highly encouraged to make this appointment. - Discussed return precautions.

## 2016-03-01 NOTE — Patient Instructions (Addendum)
Please follow up with the Pharmacist for Diabetes Education tomorrow 11/21 at 9am. Please pick up your glucometer and insulin and bring it to that visit.   Insulin Treatment for Diabetes Diabetes (diabetes mellitus) is a long-term (chronic) disease. It occurs when the body does not properly use sugar (glucose) that is released from food after digestion. Glucose levels are controlled by a hormone called insulin, which is made in the pancreas.  If you have type 1 diabetes, the pancreas does not make any insulin, so you must take insulin.  If you have type 2 diabetes, you might need to take insulin along with other medicines. In type 2 diabetes, one or both of these problems may be present:  The pancreas does not make enough insulin.  Cells in the body do not respond properly to insulin that the body makes (insulin resistance). You must use insulin correctly to control your diabetes. You must have some insulin in your body at all times. Insulin treatment varies depending on your type of diabetes, your treatment goals, and your medical history. It is important for you to understand your insulin treatment plan so you can be an active partner in managing your diabetes. How is insulin given? Insulin can only be given through a shot (injection). It is injected using a syringe and needle, an insulin pen, a pump, or a jet injector. Your health care provider will:  Prescribe the amount and type of insulin that you need.  Tell you when you should inject your insulin. Where on the body should insulin be injected? Insulin is injected into a layer of fatty tissue under the skin. Good places to inject insulin include:  Abdomen. Generally, the abdomen is the best place to inject insulin. However, you should avoid any area that is less than 2 inches (5 cm) from the belly button (navel).  Front and outer area of the upper thighs.  The back of the upper arms.  Upper buttocks. It is important to:  Give  your injection in a slightly different place each time. This helps to prevent irritation and improve absorption.  Avoid injecting into areas that have scar tissue. Usually, you will give yourself insulin injections. Others can also be taught how to give you injections. You will use a special type of syringe that is made only for insulin. Some people may have an insulin pump that delivers insulin steadily through a tube (cannula) that is placed under the skin. What are the different types of insulin? The following information is a general guide to different types of insulin. Specifics vary depending on the insulin product that your health care provider prescribes.  Rapid-acting insulin:  Starts working quickly, in as little as 5 minutes.  Can last for 4-6 hours, or sometimes longer.  Works well when taken right before a meal to quickly lower blood glucose.  Short-acting insulin:  Starts working in about 30 minutes.  Can last for 6-10 hours.  Should be taken about 30 minutes before you start eating a meal.  Intermediate-acting insulin:  Starts working in 1-2 hours.  Lasts for about 10-18 hours.  Lowers your blood glucose for a longer period of time but is not as effective for lowering blood glucose right after a meal.  Long-acting insulin:  Mimics the small amount of insulin that your pancreas usually produces throughout the day.  Should be used either one or two times a day.  Is usually used in combination with other types of insulin or other medicines.  Concentrated insulin, or U-500 insulin:  Contains a higher dose of insulin than most rapid-acting insulins. U-500 insulin has 5 times the amount of insulin per 1 mL.  Should only be used with the special U-500 syringe or U-500 insulin pen. It is dangerous to use the wrong type of syringe with this insulin. What are the side effects of insulin? Possible side effects of insulin treatment include:  Low blood glucose  (hypoglycemia).  Weight gain.  High blood glucose (hyperglycemia).  Skin injury or irritation. Some of these side effects can be caused by using improper injection technique. It is important to learn to inject insulin properly. What are common terms associated with insulin treatment? Some terms that you might hear include:  Basal insulin, or basal rate. This is the constant amount of insulin that needs to be present in your body to stabilize your blood glucose levels. People who have type 1 diabetes need basal insulin in a steady (continuous) dose 24 hours a day.  Usually, intermediate-acting or long-acting insulin is used one or two times a day to manage basal insulin levels.  Medicines that are taken by mouth may also be recommended to manage basal insulin levels.  Prandial insulin. This refers to meal-related insulin.  Blood glucose rises quickly after a meal (postprandial). Rapid-acting or short-acting insulin can be used right before a meal (preprandial) to quickly lower blood glucose.  You may be instructed to adjust the amount of prandial insulin that you take depending on how much carbohydrate (starch) is in your meal.  Corrective insulin. This may also be called a correction dose or supplemental dose. This is a small amount of rapid-acting or short-acting insulin that can be used to lower blood glucose if it is too high. You may be instructed to check your blood glucose at certain times of the day and use corrective insulin as needed.  Tight control, or intensive therapy. This means keeping your blood glucose as close to your target as possible, and preventing it from getting too high after meals. People who have tight control of their diabetes have fewer long-term problems caused by diabetes. General instructions   Talk with your health care provider or pharmacist about the type of insulin you should take and when you should take it. You should know when your insulin peaks and  when it wears off. You need this information so you can plan your meals and exercise. You also need to work with your health care provider to:  Check your blood glucose every day. Your health care provider will tell you how often and when you should do this.  Manage your:  Weight.  Blood pressure.  Cholesterol.  Stress.  Eat a healthy diet.  Exercise regularly. This information is not intended to replace advice given to you by your health care provider. Make sure you discuss any questions you have with your health care provider. Document Released: 06/25/2008 Document Revised: 09/04/2015 Document Reviewed: 05/02/2015 Elsevier Interactive Patient Education  2017 [place].  Insulin Glargine injection What is this medicine? INSULIN GLARGINE (IN su lin GLAR geen) is a human-made form of insulin. This drug lowers the amount of sugar in your blood. It is a long-acting insulin that is usually given once a day. This medicine may be used for other purposes; ask your health care provider or pharmacist if you have questions. COMMON BRAND NAME(S): BASAGLAR, Lantus, Lantus SoloStar, Toujeo SoloStar What should I tell my health care provider before I take this medicine? They  need to know if you have any of these conditions: -episodes of hypoglycemia -kidney disease -liver disease -an unusual or allergic reaction to insulin, metacresol, other medicines, foods, dyes, or preservatives -pregnant or trying to get pregnant -breast-feeding How should I use this medicine? This medicine is for injection under the skin. Use this medicine at the same time each day. Use exactly as directed. This insulin should never be mixed in the same syringe with other insulins before injection. Do not vigorously shake before use. You will be taught how to use this medicine and how to adjust doses for activities and illness. Do not use more insulin than prescribed. Always check the appearance of your insulin before  using it. This medicine should be clear and colorless like water. Do not use it if it is cloudy, thickened, colored, or has solid particles in it. It is important that you put your used needles and syringes in a special sharps container. Do not put them in a trash can. If you do not have a sharps container, call your pharmacist or healthcare provider to get one. Talk to your pediatrician regarding the use of this medicine in children. Special care may be needed. Overdosage: If you think you have taken too much of this medicine contact a poison control center or emergency room at once. NOTE: This medicine is only for you. Do not share this medicine with others. What if I miss a dose? It is important not to miss a dose. Your health care professional or doctor should discuss a plan for missed doses with you. If you do miss a dose, follow their plan. Do not take double doses. What may interact with this medicine? -other medicines for diabetes Many medications may cause an increase or decrease in blood sugar, these include: -alcohol containing beverages -aspirin and aspirin-like drugs -chloramphenicol -chromium -diuretics -female hormones, like estrogens or progestins and birth control pills -heart medicines -isoniazid -female hormones or anabolic steroids -medicines for weight loss -medicines for allergies, asthma, cold, or cough -medicines for mental problems -medicines called MAO Inhibitors like Nardil, Parnate, Marplan, Eldepryl -niacin -NSAIDs, medicines for pain and inflammation, like ibuprofen or naproxen -pentamidine -phenytoin -probenecid -quinolone antibiotics like ciprofloxacin, levofloxacin, ofloxacin -some herbal dietary supplements -steroid medicines like prednisone or cortisone -thyroid medicine Some medications can hide the warning symptoms of low blood sugar. You may need to monitor your blood sugar more closely if you are taking one of these medications. These  include: -beta-blockers such as atenolol, metoprolol, propranolol -clonidine -guanethidine -reserpine This list may not describe all possible interactions. Give your health care provider a list of all the medicines, herbs, non-prescription drugs, or dietary supplements you use. Also tell them if you smoke, drink alcohol, or use illegal drugs. Some items may interact with your medicine. What should I watch for while using this medicine? Visit your health care professional or doctor for regular checks on your progress. Do not drive, use machinery, or do anything that needs mental alertness until you know how this medicine affects you. Alcohol may interfere with the effect of this medicine. Avoid alcoholic drinks. A test called the HbA1C (A1C) will be monitored. This is a simple blood test. It measures your blood sugar control over the last 2 to 3 months. You will receive this test every 3 to 6 months. Learn how to check your blood sugar. Learn the symptoms of low and high blood sugar and how to manage them. Always carry a quick-source of sugar with you in  case you have symptoms of low blood sugar. Examples include hard sugar candy or glucose tablets. Make sure others know that you can choke if you eat or drink when you develop serious symptoms of low blood sugar, such as seizures or unconsciousness. They must get medical help at once. Tell your doctor or health care professional if you have high blood sugar. You might need to change the dose of your medicine. If you are sick or exercising more than usual, you might need to change the dose of your medicine. Do not skip meals. Ask your doctor or health care professional if you should avoid alcohol. Many nonprescription cough and cold products contain sugar or alcohol. These can affect blood sugar. Make sure that you have the right kind of syringe for the type of insulin you use. Try not to change the brand and type of insulin or syringe unless your health  care professional or doctor tells you to. Switching insulin brand or type can cause dangerously high or low blood sugar. Always keep an extra supply of insulin, syringes, and needles on hand. Use a syringe one time only. Throw away syringe and needle in a closed container to prevent accidental needle sticks. Insulin pens and cartridges should never be shared. Even if the needle is changed, sharing may result in passing of viruses like hepatitis or HIV. Wear a medical ID bracelet or chain, and carry a card that describes your disease and details of your medicine and dosage times. What side effects may I notice from receiving this medicine? Side effects that you should report to your doctor or health care professional as soon as possible: -allergic reactions like skin rash, itching or hives, swelling of the face, lips, or tongue -breathing problems -signs and symptoms of high blood sugar such as dizziness, dry mouth, dry skin, fruity breath, nausea, stomach pain, increased hunger or thirst, increased urination -signs and symptoms of low blood sugar such as feeling anxious, confusion, dizziness, increased hunger, unusually weak or tired, sweating, shakiness, cold, irritable, headache, blurred vision, fast heartbeat, loss of consciousness Side effects that usually do not require medical attention (report to your doctor or health care professional if they continue or are bothersome): -increase or decrease in fatty tissue under the skin due to overuse of a particular injection site -itching, burning, swelling, or rash at site where injected This list may not describe all possible side effects. Call your doctor for medical advice about side effects. You may report side effects to FDA at 1-800-FDA-1088. Where should I keep my medicine? Keep out of the reach of children. Store unopened vials in a refrigerator between 2 and 8 degrees C (36 and 46 degrees F). Do not freeze or use if the insulin has been frozen.  Opened vials (vials currently in use) may be stored in the refrigerator or at room temperature, at approximately 25 degrees C (77 degrees F) or cooler. Keeping your insulin at room temperature decreases the amount of pain during injection. Once opened, your insulin can be used for 28 days. After 28 days, the vial should be thrown away. Store Lantus [employer], mining in a refrigerator between 2 and 8 degrees C (36 and 46 degrees F) or at room temperature below 30 degrees C (86 degrees F). Do not freeze or use if the insulin has been frozen. Once opened, the pens should be kept at room temperature. Do not store in the refrigerator once opened. Once opened, the insulin can be used  for 28 days. After 28 days, the Lantus Solostar Pen or Basaglar KwikPen should be thrown away. Store [employer] in a refrigerator between 2 and 8 degrees C (36 and 46 degrees F). Do not freeze or use if the insulin has been frozen. Once opened, the pens should be kept at room temperature below 30 degrees C (86 degrees F). Do not store in the refrigerator once opened. Once opened, the insulin can be used for 42 days. After 42 days, the [employer] Pen should be thrown away. Protect all insulin vials and pens from light and excessive heat. Throw away any unused medicine after the expiration date or after the specified time for room temperature storage has passed. NOTE: This sheet is a summary. It may not cover all possible information. If you have questions about this medicine, talk to your doctor, pharmacist, or health care provider.  2017 Elsevier/Gold Standard (2015-05-01 10:19:14)

## 2016-03-02 ENCOUNTER — Ambulatory Visit (INDEPENDENT_AMBULATORY_CARE_PROVIDER_SITE_OTHER): Payer: Federal, State, Local not specified - PPO | Admitting: Pharmacist

## 2016-03-02 ENCOUNTER — Encounter: Payer: Self-pay | Admitting: Pharmacist

## 2016-03-02 DIAGNOSIS — E119 Type 2 diabetes mellitus without complications: Secondary | ICD-10-CM

## 2016-03-02 MED ORDER — INSULIN GLARGINE 100 UNIT/ML SOLOSTAR PEN: 6.0000 [IU] | PEN_INJECTOR | Freq: Every day | SUBCUTANEOUS | 0 refills | Status: DC

## 2016-03-02 NOTE — Progress Notes (Signed)
    S:    Patient arrives alone, ambulating without assistance in a pleasant but apprehensive mood regarding her newly diagnosed diabetes. She was offered insulin injection at the visit yesterday which she refused as she was "not ready" to start insulin therapy.  Presents for diabetes evaluation, education, management, and to address concerns regarding starting insulin therapy at the request of Dr. Gerlean Ren. Patient was referred on 03/01/2016.  Patient was last seen by Primary Care Provider on  01/12/2016.  She was ~ 45 minutes late due to her need to pick up a new meter at the local pharmacy.  Patient reports she has had borderline Diabetes since last year and was diagnosed yesterday: 03/01/16.   Patient reports adherence with medications.  Current diabetes medications include: none Current hypertension medications include: Irbesartan-HCTZ 300-25mg  Qday  Patient denies hypoglycemic events.  Patient reported dietary habits: Eats 3 meals/day Breakfast: eggs, oatmeal  Other meals not discussed Drinks: tea with honey, coffee with occasional creamer   Patient denies nocturia. Reports xerostomia, thirst, fatigue  Patient reports both mother and father had dx of diabetes.   O:  Lab Results  Component Value Date   HGBA1C 14.3 03/01/2016   Vitals:   03/02/16 0956  BP: (!) 158/89  Pulse: 82    Home fasting CBG: >600 today and yesterday in the office   A/P: Diabetes newly diagnosed currently uncontrolled and untreated. Patient denies hypoglycemic events and was counseled today regarding steps to approach hypoglycemia. Control is suboptimal due to new diagnosis yesterday and initiation of treatment with insulin today. Patient was counseled on proper use of OneTouch meter and how to check her blood sugar. Instructed  to check her blood sugar every morning and if hypoglycemic symptoms.  Started basal insulin Lantus (insulin glargine) to 6 units every morning. Patient was counseled on proper  administration of her Lantus Solostar pen and to rotate injection sites around her abdomen. She practiced twice using a flesh pad and model pen. She was apprehensive regarding starting insulin, but was able to inject herself in the office following instruction with the real Lantus Solostar pen.    Next A1C anticipated in 3 months (around February).    Hypertension longstanding currently treated, but uncontrolled.  Patient reports adherence with medication. Control is suboptimal due to recent discontinuation of amlodipine and possible stress secondary to uncontrolled high blood glucose. HTN was not addressed at this appointment.   Written patient instructions provided.  Total time in face to face counseling 60 minutes.   Follow up in Pharmacist Clinic Visit on 03/11/16.   Patient seen with Shonna Chock, PharmD Candidate and Glorious Peach, PharmD PGY1 Resident.

## 2016-03-02 NOTE — Patient Instructions (Signed)
It was great to see you today!  Please use 6 units of Lantus one time each morning until we talk again. Please check your blood sugar one time each day and as needed for symptoms of low blood sugar.  If you feel that your blood sugar is low or you just don't feel right, check your blood sugar with your meter and correct your blood sugar with honey, juice or hard candy. If your blood sugar gets low two or more times before we see you next week, call us here at the clinic and we can adjust your insulin dose.  Make an appointment on your way out to see Dr. Valentina Lucks late next week.

## 2016-03-02 NOTE — Assessment & Plan Note (Addendum)
-   Diagnosed today with A1C 14.3. Note last A1C 6.5 in 01/2015. - Will obtain CMP - Discussed initiating insulin today. Prescription for Lantus 10units given. Does not feel comfortable starting today and refuses first dose here in clinic today. Feeling overwhelmed with new diagnosis and would like to think about everything tonight and consider starting tomorrow. Discussed some risk of hospitalization with untreated diabetes and she acknowledges this risk.  - Urinalysis with 1+ bacteria, >1000 glucose, trace ketones - Prescription for Glucometer given - To follow up with Pharmacy tomorrow for diabetes education. To bring Glucometer and new Lantus pens to this visit. Highly encouraged to make this appointment. - Discussed return precautions.

## 2016-03-02 NOTE — Progress Notes (Signed)
Patient ID: Samantha Norton, female   DOB: 20-Aug-1931, 82 y.o.   MRN: [id number] Reviewed: Agree with Dr. Graylin Shiver documentation and management.

## 2016-03-02 NOTE — Assessment & Plan Note (Signed)
Diabetes newly diagnosed currently uncontrolled and untreated. Patient denies hypoglycemic events and was counseled today regarding steps to approach hypoglycemia. Control is suboptimal due to new diagnosis yesterday and initiation of treatment with insulin today. Patient was counseled on proper use of OneTouch meter and how to check her blood sugar. Instructed  to check her blood sugar every morning and if hypoglycemic symptoms.  Started basal insulin Lantus (insulin glargine) to 6 units every morning. Patient was counseled on proper administration of her Lantus Solostar pen and to rotate injection sites around her abdomen. She practiced twice using a flesh pad and model pen. She was apprehensive regarding starting insulin, but was able to inject herself in the office following instruction with the real Lantus Solostar pen.

## 2016-03-11 ENCOUNTER — Ambulatory Visit (INDEPENDENT_AMBULATORY_CARE_PROVIDER_SITE_OTHER): Payer: Federal, State, Local not specified - PPO | Admitting: Pharmacist

## 2016-03-11 ENCOUNTER — Encounter: Payer: Self-pay | Admitting: Pharmacist

## 2016-03-11 DIAGNOSIS — E119 Type 2 diabetes mellitus without complications: Secondary | ICD-10-CM | POA: Diagnosis not present

## 2016-03-11 DIAGNOSIS — I10 Essential (primary) hypertension: Secondary | ICD-10-CM

## 2016-03-11 MED ORDER — INSULIN GLARGINE 100 UNIT/ML SOLOSTAR PEN: 8.0000 [IU] | PEN_INJECTOR | Freq: Every day | SUBCUTANEOUS | Status: DC

## 2016-03-11 NOTE — Patient Instructions (Signed)
Thank you for visiting today! Increase your insulin to 8 units every morning. Expect a phone call from Dr. Valentina Lucks on 03/19/16 as follow-up.

## 2016-03-11 NOTE — Assessment & Plan Note (Signed)
Diabetes newly diagnosed currently uncontrolled, but improved since starting Lantus about a week ago. Patient denies hypoglycemic events and is able to verbalize appropriate hypoglycemia management plan. Patient reports adherence with medication. Control is suboptimal at this time as inuslin is still in the process of being adjusted.  Increased dose of basal insulin Lantus (insulin glargine) to 8 units QAM.  Next A1C anticipated in 3 months.

## 2016-03-11 NOTE — Progress Notes (Signed)
    S:    Patient arrives ambulating without assistance.  Presents for diabetes evaluation, education, and management at the request of Dr. Gerlean Ren. Patient was referred on 03/01/16.  Patient was last seen by Primary Care Provider on 03/01/16. She was present at a visit with Dr. Valentina Lucks on 03/02/16 where lantus 6 units QAM was started. She was also counseled on proper use of insulin pen and blood glucose supplies at that visit. Over the last week, she has been using her insulin consistently and has checked her blood sugar every morning before breakfast. She was able to demonstrate proper technique today.   Patient reports Diabetes was diagnosed in 03/01/16.   Patient reports adherence with medications.  Current diabetes medications include: Lantus Current hypertension medications include: Irbesartan-HCTZ  Patient denies hypoglycemic events.  Patient reported dietary habits: Eats 3 meals/day Breakfast: eggs, oatmeal, smoothie Lunch: vegetables and chicken or salmon Dinner: vegetables and chicken or salmon Snacks: apples Drinks: water, tea, and coffee. Puts honey in tea and cream in coffee  Patient reported exercise habits: walks every Wednesday with club and gets about 9000 steps. Only gets about 2000-3000 steps on other days.    Patient denies nocturia.  Patient denies neuropathy. Patient denies visual changes. Patient gets annual eye exams.  Patient denies self foot exams. Patient was counseled to start performing regular foot exams  O:  Lab Results  Component Value Date   HGBA1C 14.3 03/01/2016   There were no vitals filed for this visit.  Home fasting CBG: 200s-300s 2 hour post-prandial/random CBG: high 300s.  A/P: Diabetes newly diagnosed currently uncontrolled, but improved since starting Lantus about a week ago. Patient denies hypoglycemic events and is able to verbalize appropriate hypoglycemia management plan. Patient reports adherence with medication. Control is suboptimal  at this time as inuslin is still in the process of being adjusted.  Increased dose of basal insulin Lantus (insulin glargine) to 8 units QAM.  Next A1C anticipated in 3 months.    Hypertension longstanding currently controlled. Her goal BP given her age >80 yo is 150/90 per JNC 8. BP was 147/85 today in the office. Patient reports adherence with medication.   Written patient instructions provided.  Total time in face to face counseling 30 minutes.    Follow up with Dr. Valentina Lucks over the phone next Friday 03/19/16. Next office visit is anticipated for January 2018 and will be scheduled after phone call. Patient seen with Shonna Chock, PharmD Candidate and Glorious Peach, PharmD PGY1 Resident.

## 2016-03-11 NOTE — Assessment & Plan Note (Signed)
Hypertension longstanding currently controlled. Her goal BP given her age >80 yo is 150/90 per JNC 8. BP was 147/85 today in the office. Patient reports adherence with medication.

## 2016-03-15 NOTE — Progress Notes (Signed)
Patient ID: Samantha Norton, female   DOB: 25-Mar-1932, 82 y.o.   MRN: [id number] Reviewed: Agree with Dr. Graylin Shiver documentation and management.

## 2016-03-23 ENCOUNTER — Ambulatory Visit (HOSPITAL_BASED_OUTPATIENT_CLINIC_OR_DEPARTMENT_OTHER): Payer: Federal, State, Local not specified - PPO | Admitting: Oncology

## 2016-03-23 ENCOUNTER — Telehealth: Payer: Self-pay | Admitting: Oncology

## 2016-03-23 ENCOUNTER — Other Ambulatory Visit (HOSPITAL_BASED_OUTPATIENT_CLINIC_OR_DEPARTMENT_OTHER): Payer: Federal, State, Local not specified - PPO

## 2016-03-23 VITALS — BP 194/95 | HR 66 | Temp 98.0°F | Resp 18 | Ht 62.0 in | Wt 124.1 lb

## 2016-03-23 DIAGNOSIS — D696 Thrombocytopenia, unspecified: Secondary | ICD-10-CM

## 2016-03-23 LAB — CBC WITH DIFFERENTIAL/PLATELET
BASO%: 1.9 % (ref 0.0–2.0)
BASOS ABS: 0.1 10*3/uL (ref 0.0–0.1)
EOS%: 7.9 % — AB (ref 0.0–7.0)
Eosinophils Absolute: 0.4 10*3/uL (ref 0.0–0.5)
HCT: 41.4 % (ref 34.8–46.6)
HEMOGLOBIN: 13.2 g/dL (ref 11.6–15.9)
LYMPH%: 35.8 % (ref 14.0–49.7)
MCH: 28.3 pg (ref 25.1–34.0)
MCHC: 31.8 g/dL (ref 31.5–36.0)
MCV: 89.1 fL (ref 79.5–101.0)
MONO#: 0.3 10*3/uL (ref 0.1–0.9)
MONO%: 5.4 % (ref 0.0–14.0)
NEUT#: 2.3 10*3/uL (ref 1.5–6.5)
NEUT%: 49 % (ref 38.4–76.8)
Platelets: 107 10*3/uL — ABNORMAL LOW (ref 145–400)
RBC: 4.65 10*6/uL (ref 3.70–5.45)
RDW: 13.5 % (ref 11.2–14.5)
WBC: 4.6 10*3/uL (ref 3.9–10.3)
lymph#: 1.7 10*3/uL (ref 0.9–3.3)

## 2016-03-23 NOTE — Telephone Encounter (Signed)
Appointments scheduled per 12/12 LOS. Patient given AVS report and calendars with future scheduled appointments.  °

## 2016-03-23 NOTE — Progress Notes (Signed)
Hematology and Oncology Follow Up Visit  Samantha Norton [id number] 1931/10/27 82 y.o. 03/23/2016 2:29 PM     Principle Diagnosis: This is a 82 year old female initially diagnosed with thrombocytopenia that has been mild and chronic in nature diagnosed in 2010. She has element of reactive in etiology as well as immune thrombocytopenia.   Current therapy: Observation and surveillance.    Interim History:  Ms. Samantha Norton presents today for a followup visit. Since the last visit, she reports no major changes in her health. She denied any epistaxis since the last visit. She has not had any other bleeding episodes such as hematochezia or melena. Has not reported any petechiae or easy bruisability. She has not reported any constitutional symptoms of fevers or chills or sweats. She continues to live independently and attends to activities of daily living. She did report that she is currently on insulin for her diabetes and her blood pressure has been under reasonable control.  She has not reported any headaches or change in her mental status.She has not reported any chest pain, shortness of breath or difficulty breathing. She has not reported any cough or hemoptysis. Does not report any nausea, vomiting or abdominal pain. She has not reported any frequency urgency or hesitancy. Rest of her review of systems unremarkable.  Medications: I have reviewed the patient's current medications.   Current Outpatient Prescriptions  Medication Sig Dispense Refill  . B Complex Vitamins (B COMPLEX 100 PO) Take 1 tablet by mouth daily.    . blood glucose meter kit and supplies KIT Dispense based on patient and insurance preference. Use up to four times daily as directed. (FOR ICD-9 250.00, 250.01). 1 each 0  . calcium-vitamin D (OSCAL WITH D) 250-125 MG-UNIT tablet Take 1 tablet by mouth daily. 30 tablet 11  . FLUVIRIN PRESERVATIVE FREE SUSP injection     . Insulin Glargine (LANTUS) 100 UNIT/ML Solostar Pen Inject 8 Units  into the skin daily with breakfast.    . irbesartan-hydrochlorothiazide (AVALIDE) 150-12.5 MG tablet TAKE 2 TABLETS DAILY (Patient taking differently: 1 tablet 2 (two) times daily. TAKE 2 TABLETS DAILY) 180 tablet 4  . multivitamin (THERAGRAN) per tablet Take 1 tablet by mouth daily.      . Olopatadine HCl (PATADAY) 0.2 % SOLN Place 1 drop into both eyes daily as needed. 2.5 mL 11  . ONE TOUCH ULTRA TEST test strip Inject 1 Device into the skin daily.    Glory Rosebush DELICA LANCETS 40J MISC Inject 1 Device into the skin daily.    . TURMERIC PO Take 1 tablet by mouth daily.     No current facility-administered medications for this visit.     Allergies: No Known Allergies  Past Medical History, Surgical history, Social history, and Family History were reviewed and updated.  Physical Exam: Blood pressure (!) 194/95, pulse 66, temperature 98 F (36.7 C), temperature source Oral, resp. rate 18, height 5' 2"  (1.575 m), weight 124 lb 1.6 oz (56.3 kg), SpO2 99 %. ECOG: 1 General appearance: Well-appearing woman without distress. Head: Normocephalic, without obvious abnormality no oral ulcers or lesions. Neck: no adenopathy Lymph nodes: Cervical, supraclavicular, and axillary nodes normal. Heart:regular rate and rhythm, S1, S2 normal, no murmur, click, rub or gallop Lung:chest clear, no wheezing, rales, normal symmetric air entry. Abdomin: soft, non-tender, without masses or organomegaly no rebound or guarding. EXT:no erythema, induration, or nodules Skin: no rash, no petechia    Lab Results: Lab Results  Component Value Date   WBC 4.6  03/23/2016   HGB 13.2 03/23/2016   HCT 41.4 03/23/2016   MCV 89.1 03/23/2016   PLT 107 (L) 03/23/2016     Chemistry      Component Value Date/Time   NA 130 (L) 03/01/2016 1601   NA 139 03/25/2015 1304   K 3.9 03/01/2016 1601   K 3.9 03/25/2015 1304   CL 92 (L) 03/01/2016 1601   CL 107 02/11/2012 1441   CO2 28 03/01/2016 1601   CO2 27 03/25/2015  1304   BUN 23 03/01/2016 1601   BUN 14.6 03/25/2015 1304   CREATININE 1.17 (H) 03/01/2016 1601   CREATININE 1.1 03/25/2015 1304      Component Value Date/Time   CALCIUM 10.1 03/01/2016 1601   CALCIUM 9.8 03/25/2015 1304   ALKPHOS 58 03/01/2016 1601   ALKPHOS 60 03/25/2015 1304   AST 16 03/01/2016 1601   AST 24 03/25/2015 1304   ALT 17 03/01/2016 1601   ALT 22 03/25/2015 1304   BILITOT 1.0 03/01/2016 1601   BILITOT 0.82 03/25/2015 1304       Impression and Plan:  This is a 82 year old female with the following issues. 1. Anemia. Normochromic normocytic which has resolved at this time. This is no longer an issue. 2. Mild thrombocytopenia.  Platelet counts have been close 100,000.  Differential diagnosis include medication versus autoimmune phenomenon such as idiopathic thrombocytopenic purpura. She has no clinical signs or symptoms of bleeding at this time and platelet counts remain stable. I will repeat her CBC annually and consider steroid therapy if she develops platelet count less than 50. 3.     Follow-up: Will be in 12 months. Sooner if needed.    Zola Button, MD 12/12/20172:29 PM

## 2016-03-29 ENCOUNTER — Other Ambulatory Visit: Payer: Self-pay | Admitting: *Deleted

## 2016-03-29 MED ORDER — ONETOUCH ULTRA BLUE VI STRP: 1.0000 | ORAL_STRIP | Freq: Three times a day (TID) | 11 refills | Status: DC

## 2016-05-14 ENCOUNTER — Telehealth: Payer: Self-pay | Admitting: Internal Medicine

## 2016-05-14 ENCOUNTER — Other Ambulatory Visit: Payer: Self-pay | Admitting: Internal Medicine

## 2016-05-14 DIAGNOSIS — E118 Type 2 diabetes mellitus with unspecified complications: Secondary | ICD-10-CM

## 2016-05-14 DIAGNOSIS — Z794 Long term (current) use of insulin: Principal | ICD-10-CM

## 2016-05-14 NOTE — Telephone Encounter (Signed)
Would like referral to [facility]. She is having problems with diabetes. Her [employer] told her she needed to see an endocrinologist. Please advise

## 2016-05-25 ENCOUNTER — Ambulatory Visit (INDEPENDENT_AMBULATORY_CARE_PROVIDER_SITE_OTHER): Payer: Federal, State, Local not specified - PPO | Admitting: Student

## 2016-05-25 ENCOUNTER — Encounter: Payer: Self-pay | Admitting: Student

## 2016-05-25 ENCOUNTER — Ambulatory Visit (HOSPITAL_COMMUNITY)
Admission: RE | Admit: 2016-05-25 | Discharge: 2016-05-25 | Disposition: A | Discharge status: Home / Self Care | Payer: Federal, State, Local not specified - PPO | Source: Ambulatory Visit | Attending: Family Medicine | Admitting: Family Medicine

## 2016-05-25 VITALS — BP 130/90 | HR 70 | Temp 97.7°F | Wt 119.0 lb

## 2016-05-25 DIAGNOSIS — N182 Chronic kidney disease, stage 2 (mild): Secondary | ICD-10-CM

## 2016-05-25 DIAGNOSIS — R1013 Epigastric pain: Secondary | ICD-10-CM

## 2016-05-25 DIAGNOSIS — N200 Calculus of kidney: Secondary | ICD-10-CM | POA: Insufficient documentation

## 2016-05-25 DIAGNOSIS — K769 Liver disease, unspecified: Secondary | ICD-10-CM | POA: Insufficient documentation

## 2016-05-25 DIAGNOSIS — E1122 Type 2 diabetes mellitus with diabetic chronic kidney disease: Secondary | ICD-10-CM

## 2016-05-25 DIAGNOSIS — R1900 Intra-abdominal and pelvic swelling, mass and lump, unspecified site: Secondary | ICD-10-CM | POA: Insufficient documentation

## 2016-05-25 DIAGNOSIS — R110 Nausea: Secondary | ICD-10-CM | POA: Insufficient documentation

## 2016-05-25 DIAGNOSIS — K7689 Other specified diseases of liver: Secondary | ICD-10-CM | POA: Insufficient documentation

## 2016-05-25 DIAGNOSIS — R5383 Other fatigue: Secondary | ICD-10-CM

## 2016-05-25 DIAGNOSIS — K8020 Calculus of gallbladder without cholecystitis without obstruction: Secondary | ICD-10-CM | POA: Diagnosis not present

## 2016-05-25 DIAGNOSIS — R109 Unspecified abdominal pain: Secondary | ICD-10-CM | POA: Insufficient documentation

## 2016-05-25 LAB — BASIC METABOLIC PANEL
BUN: 25 mg/dL (ref 7–25)
CHLORIDE: 94 mmol/L — AB (ref 98–110)
CO2: 26 mmol/L (ref 20–31)
CREATININE: 0.95 mg/dL — AB (ref 0.60–0.88)
Calcium: 9.8 mg/dL (ref 8.6–10.4)
Glucose, Bld: 370 mg/dL — ABNORMAL HIGH (ref 65–99)
POTASSIUM: 3.8 mmol/L (ref 3.5–5.3)
SODIUM: 136 mmol/L (ref 135–146)

## 2016-05-25 NOTE — Assessment & Plan Note (Signed)
Blood sugars elevated in the setting of not taking her insulin - will follow with PCP

## 2016-05-25 NOTE — Assessment & Plan Note (Signed)
Abdominal pain in the setting of a palpable abdominal swelling concerning for mass versus gastric distension - will obtain CT abdomen and follow closely - BMP today

## 2016-05-25 NOTE — Assessment & Plan Note (Signed)
Has been a chronic issue which is likely currently contributed to by her abdominal pain - will follow

## 2016-05-25 NOTE — Progress Notes (Signed)
Patient will need continued work up for cystic liver lesions which are now symptomatic

## 2016-05-25 NOTE — Progress Notes (Signed)
   Subjective:    Patient ID: Samantha Norton, female    DOB: Mar 01, 1932, 83 y.o.   MRN: [id number]   CC: emesis, not feeling well  HPI: 83 y/o with h/o DM presents for emesis and not feeling well   Emesis/abdominal pain - started 2 days ago - now resolved, no emesis today - has not had any diarrhea - continues to feel abdominal pain worse under right ribs - normal bowel movements  Not feeling well - reports not feeling well since starting insulin  - feels weak and fatigued  Diabetes - glucoses have been in the 300s mostly one 485 one week ago - has not been taking lantus but unable to tell me for how long    Smoking status reviewed  Review of Systems  Per HPI else denies chest pain, SOB   Objective:  BP 130/90   Pulse 70   Temp 97.7 F (36.5 C) (Oral)   Wt 119 lb (54 kg)   SpO2 94%   BMI 21.77 kg/m  Vitals and nursing note reviewed  General: NAD Cardiac: RRR Respiratory: CTAB, normal effort Abdomen: firmness extending from the epigastric region to the right upper quadrant beneath the costal margin with mild tenderness to palpation, no rebound or guarding Bowel sounds present Skin: warm and dry, no rashes noted Neuro: alert and oriented, no focal deficits   Assessment & Plan:    Diabetes (HCC) Blood sugars elevated in the setting of not taking her insulin - will follow with PCP  Fatigue Has been a chronic issue which is likely currently contributed to by her abdominal pain - will follow  Abdominal pain Abdominal pain in the setting of a palpable abdominal swelling concerning for mass versus gastric distension - will obtain CT abdomen and follow closely - BMP today    Samantha [facility] A. Lincoln Brigham MD, [facility] Resident PGY-3 Pager [phone]

## 2016-05-25 NOTE — Patient Instructions (Signed)
Please obtain CT of your abdomen If you have worsening symptoms, call the office at [phone]

## 2016-05-25 NOTE — Telephone Encounter (Signed)
Patient called regarding her CT results. She did not answer so a message was left informing her and asking her to make an appointment with her PCP for further work up

## 2016-05-26 ENCOUNTER — Encounter: Payer: Self-pay | Admitting: Student in an Organized Health Care Education/Training Program

## 2016-05-26 ENCOUNTER — Ambulatory Visit (INDEPENDENT_AMBULATORY_CARE_PROVIDER_SITE_OTHER)
Payer: Federal, State, Local not specified - PPO | Admitting: Student in an Organized Health Care Education/Training Program

## 2016-05-26 VITALS — HR 72 | Temp 97.2°F | Wt 120.0 lb

## 2016-05-26 DIAGNOSIS — K769 Liver disease, unspecified: Secondary | ICD-10-CM

## 2016-05-26 DIAGNOSIS — E119 Type 2 diabetes mellitus without complications: Secondary | ICD-10-CM

## 2016-05-26 NOTE — Telephone Encounter (Signed)
Called pt- VM left to have her schedule an apt with PCP to go over her CT results. If pt calls back please assist her in scheduling next available apt with her PCP.

## 2016-05-26 NOTE — Assessment & Plan Note (Signed)
Patient was noted to be hyperglycemic on BMP yesterday, and notes that she has discontinued her Lantus because she believes is contributing to her liver pain. I assured her that the 2 are most likely not related. I asked that she continue her Lantus and schedule follow-up with her PCP for other medication options for diabetes if she feels strongly about coming off of Lantus. - Risks of hyperglycemia and not taking medication were provided

## 2016-05-26 NOTE — Patient Instructions (Signed)
It was a pleasure seeing you today in our clinic. Today we discussed your CT scan, which showed some cystic lesions in your liver which are uncertain in origin. Another picture is required to see these more clearly. Here is the treatment plan we have discussed and agreed upon together:  - Today we will schedule an MRI of the abdomen  - Please use tylenol for pain control   We will call you with the results of your tests. If you do not receive a call from [place] within 1 week of the test please call our office back.  Our clinic's number is [phone]. Please call with questions or concerns about what we discussed today.  Be well, Dr. Burr Medico

## 2016-05-26 NOTE — Assessment & Plan Note (Signed)
Uncertain etiology. Multiple hypoattenuating lesions were noted within the liver predominantly in the hepatic dome. MRI of the abdomen with and without contrast is recommended. - Explained the results of the CT abdomen to the patient - Kidney function was normal with creatinine of 0.95 on BMP yesterday, and so MRI with contrast was ordered - In 02/2016 CMP demonstrates normal liver function, and so Tylenol was recommended for abdominal pain - We will follow-up with the results of the patient's MRI, asked patient to reach out to our office if she does not hear back from [place] within 1 week of the test.

## 2016-05-26 NOTE — Progress Notes (Signed)
CC: CT scan results  HPI: Samantha Norton is a 83 y.o. female who presented to the [facility] yesterday with complaints of abdominal pain and emesis and was sent for abdominal CT.   Patient arrives today to receive the results of her abdominal CT scan.   Since being seen yesterday she's had continued right-sided pain that prevents her from taking deep breaths. Her nausea has resolved vomiting has resolved. She has to eat small amounts at a time. No fevers. No recent weight loss. No dizziness.  Review of Symptoms:  See HPI for ROS.   CC, SH/smoking status, and VS noted.  Objective: Pulse 72   Temp 97.2 F (36.2 C) (Oral)   Wt 120 lb (54.4 kg)   SpO2 95%   BMI 21.95 kg/m  GEN: NAD, alert, cooperative, and pleasant, nontoxic appearing RESPIRATORY: clear to auscultation bilaterally with no wheezes, rhonchi or rales, good effort CV: RRR, no m/r/g, no peripheral edema GI: soft, +tenderness to palpation in epigastric region without rebound or guarding   NEURO: II-XII grossly intact, normal gait, peripheral sensation intact PSYCH: AAOx3, appropriate affect  EXAM: CT ABDOMEN AND PELVIS WITHOUT CONTRAST  TECHNIQUE: Multidetector CT imaging of the abdomen and pelvis was performed following the standard protocol without IV contrast.  COMPARISON:  None.  FINDINGS: Lower chest: No pulmonary nodules. No visible pleural or pericardial effusion. There is a calcified subcarinal lymph node.  Hepatobiliary: There is a large fluid density structure within the liver measuring 19 x 14 cm. There is a smaller cyst in the lower right hepatic lobe measuring 1.3 cm. No biliary dilatation. There are multiple areas of low density in the hepatic dome. There is cholelithiasis without evidence of acute cholecystitis.  Pancreas: Visualization of the pancreas is limited. The pancreatic tail appears normal.  Spleen: Normal.  Adrenals/Urinary Tract: Normal adrenal glands. There  bilateral nonobstructing renal stones measuring up to 3 mm. No hydronephrosis. Hyperdense focus in the anterior right kidney, likely proteinaceous cyst.  Stomach/Bowel: No abnormal bowel dilatation. No bowel wall thickening or adjacent fat stranding to indicate acute inflammation. No abdominal fluid collection. Normal appendix.  Vascular/Lymphatic: There is atherosclerotic calcification of the non aneurysmal abdominal aorta. No abdominal or pelvic adenopathy.  Reproductive: Status post hysterectomy.  No adnexal mass.  Musculoskeletal: Severe lower lumbar facet arthrosis. No spinal canal stenosis. No lytic or blastic lesions. Normal visualized extrathoracic and extraperitoneal soft tissues.  Other: No contributory non-categorized findings.  IMPRESSION: 1. Multiple to hypoattenuating lesions within the liver, predominantly in the hepatic dome. The the density measures greater than would be expected for simple cystic lesions. Otherwise, these lesions are incompletely evaluated in the absence of IV contrast and are concerning for neoplastic process. MRI of the abdomen with and without contrast is recommended, if there are no contraindications. Otherwise, a multiphase hepatic CT would be an alternative. 2. Massive hepatic cyst measuring up to 19 cm. 3. Bilateral nonobstructing nephrolithiasis. 4. Cholelithiasis.   Assessment and plan:  Liver lesion Uncertain etiology. Multiple hypoattenuating lesions were noted within the liver predominantly in the hepatic dome. MRI of the abdomen with and without contrast is recommended. - Explained the results of the CT abdomen to the patient - Kidney function was normal with creatinine of 0.95 on BMP yesterday, and so MRI with contrast was ordered - In 02/2016 CMP demonstrates normal liver function, and so Tylenol was recommended for abdominal pain - We will follow-up with the results of the patient's MRI, asked patient to reach out to  our  office if she does not hear back from [place] within 1 week of the test.  Diabetes [facility]) Patient was noted to be hyperglycemic on BMP yesterday, and notes that she has discontinued her Lantus because she believes is contributing to her liver pain. I assured her that the 2 are most likely not related. I asked that she continue her Lantus and schedule follow-up with her PCP for other medication options for diabetes if she feels strongly about coming off of Lantus. - Risks of hyperglycemia and not taking medication were provided   Orders Placed This Encounter  Procedures  . MR Abdomen W Wo Contrast    Standing Status:   Future    Standing Expiration Date:   07/24/2017    Order Specific Question:   If indicated for the ordered procedure, I authorize the administration of contrast media per Radiology protocol    Answer:   Yes    Order Specific Question:   Reason for Exam (SYMPTOM  OR DIAGNOSIS REQUIRED)    Answer:   CT with liver cysts    Order Specific Question:   Preferred imaging location?    Answer:   [facility] (table limit-500 lbs)    Order Specific Question:   What is the patient's sedation requirement?    Answer:   No Sedation    Order Specific Question:   Does the patient have a pacemaker or implanted devices?    Answer:   No    No orders of the defined types were placed in this encounter.    Everrett Coombe, MD,MS,  PGY1 05/26/2016 5:12 PM

## 2016-05-26 NOTE — Telephone Encounter (Signed)
Patient came into clinic today and saw Dr. Burr Medico.  Suvan Stcyr,CMA

## 2016-05-31 ENCOUNTER — Encounter (HOSPITAL_COMMUNITY): Payer: Self-pay | Admitting: Emergency Medicine

## 2016-05-31 ENCOUNTER — Emergency Department (HOSPITAL_COMMUNITY)
Admission: EM | Admit: 2016-05-31 | Discharge: 2016-05-31 | Disposition: A | Discharge status: Home / Self Care | Payer: Federal, State, Local not specified - PPO | Attending: Emergency Medicine | Admitting: Emergency Medicine

## 2016-05-31 DIAGNOSIS — Z794 Long term (current) use of insulin: Secondary | ICD-10-CM | POA: Diagnosis not present

## 2016-05-31 DIAGNOSIS — Z91199 Patient's noncompliance with other medical treatment and regimen due to unspecified reason: Secondary | ICD-10-CM

## 2016-05-31 DIAGNOSIS — Z79899 Other long term (current) drug therapy: Secondary | ICD-10-CM | POA: Insufficient documentation

## 2016-05-31 DIAGNOSIS — Z87891 Personal history of nicotine dependence: Secondary | ICD-10-CM | POA: Diagnosis not present

## 2016-05-31 DIAGNOSIS — D649 Anemia, unspecified: Secondary | ICD-10-CM | POA: Diagnosis not present

## 2016-05-31 DIAGNOSIS — E1165 Type 2 diabetes mellitus with hyperglycemia: Secondary | ICD-10-CM | POA: Insufficient documentation

## 2016-05-31 DIAGNOSIS — Z9119 Patient's noncompliance with other medical treatment and regimen: Secondary | ICD-10-CM | POA: Diagnosis not present

## 2016-05-31 DIAGNOSIS — I10 Essential (primary) hypertension: Secondary | ICD-10-CM

## 2016-05-31 DIAGNOSIS — D696 Thrombocytopenia, unspecified: Secondary | ICD-10-CM | POA: Insufficient documentation

## 2016-05-31 LAB — CBC WITH DIFFERENTIAL/PLATELET
BASOS ABS: 0 10*3/uL (ref 0.0–0.1)
BASOS PCT: 0 %
EOS ABS: 0.7 10*3/uL (ref 0.0–0.7)
EOS PCT: 5 %
HEMATOCRIT: 34.7 % — AB (ref 36.0–46.0)
Hemoglobin: 11.3 g/dL — ABNORMAL LOW (ref 12.0–15.0)
Lymphocytes Relative: 4 %
Lymphs Abs: 0.6 10*3/uL — ABNORMAL LOW (ref 0.7–4.0)
MCH: 27.1 pg (ref 26.0–34.0)
MCHC: 32.6 g/dL (ref 30.0–36.0)
MCV: 83.2 fL (ref 78.0–100.0)
MONO ABS: 0.6 10*3/uL (ref 0.1–1.0)
MONOS PCT: 4 %
NEUTROS ABS: 12.1 10*3/uL — AB (ref 1.7–7.7)
Neutrophils Relative %: 87 %
Platelets: 89 10*3/uL — ABNORMAL LOW (ref 150–400)
RBC: 4.17 MIL/uL (ref 3.87–5.11)
RDW: 14.5 % (ref 11.5–15.5)
WBC: 14 10*3/uL — ABNORMAL HIGH (ref 4.0–10.5)

## 2016-05-31 LAB — URINALYSIS, ROUTINE W REFLEX MICROSCOPIC
Bacteria, UA: NONE SEEN
Bilirubin Urine: NEGATIVE
HGB URINE DIPSTICK: NEGATIVE
KETONES UR: 20 mg/dL — AB
Leukocytes, UA: NEGATIVE
Nitrite: NEGATIVE
PH: 5 (ref 5.0–8.0)
PROTEIN: NEGATIVE mg/dL
Specific Gravity, Urine: 1.027 (ref 1.005–1.030)

## 2016-05-31 LAB — CBG MONITORING, ED: GLUCOSE-CAPILLARY: 472 mg/dL — AB (ref 65–99)

## 2016-05-31 MED ORDER — INSULIN GLARGINE 100 UNIT/ML ~~LOC~~ SOLN: 8.0000 [IU] | Freq: Every day | SUBCUTANEOUS | 0 refills | Status: DC

## 2016-05-31 MED ORDER — SODIUM CHLORIDE 0.9 % IV BOLUS (SEPSIS)
2000.0000 mL | Freq: Once | INTRAVENOUS | Status: AC
  Administered 2016-05-31: 2000 mL via INTRAVENOUS

## 2016-05-31 MED ORDER — INSULIN GLARGINE 100 UNIT/ML ~~LOC~~ SOLN
8.0000 [IU] | Freq: Once | SUBCUTANEOUS | Status: AC
  Administered 2016-05-31: 8 [IU] via SUBCUTANEOUS
  Filled 2016-05-31: qty 0.08

## 2016-05-31 NOTE — ED Notes (Signed)
Pt ambulatory to restroom

## 2016-05-31 NOTE — ED Provider Notes (Signed)
Medical screening examination/treatment/procedure(s) were conducted as a shared visit with non-physician practitioner(s) and myself.  I personally evaluated the patient during the encounter.   EKG Interpretation None       Results for orders placed or performed during the hospital encounter of 05/31/16  CBG monitoring, ED  Result Value Ref Range   Glucose-Capillary >600 (HH) 65 - 99 mg/dL   Ct Abdomen Pelvis Wo Contrast  Result Date: 05/25/2016 CLINICAL DATA:  Nausea and pain and right-sided roots EXAM: CT ABDOMEN AND PELVIS WITHOUT CONTRAST TECHNIQUE: Multidetector CT imaging of the abdomen and pelvis was performed following the standard protocol without IV contrast. COMPARISON:  None. FINDINGS: Lower chest: No pulmonary nodules. No visible pleural or pericardial effusion. There is a calcified subcarinal lymph node. Hepatobiliary: There is a large fluid density structure within the liver measuring 19 x 14 cm. There is a smaller cyst in the lower right hepatic lobe measuring 1.3 cm. No biliary dilatation. There are multiple areas of low density in the hepatic dome. There is cholelithiasis without evidence of acute cholecystitis. Pancreas: Visualization of the pancreas is limited. The pancreatic tail appears normal. Spleen: Normal. Adrenals/Urinary Tract: Normal adrenal glands. There bilateral nonobstructing renal stones measuring up to 3 mm. No hydronephrosis. Hyperdense focus in the anterior right kidney, likely proteinaceous cyst. Stomach/Bowel: No abnormal bowel dilatation. No bowel wall thickening or adjacent fat stranding to indicate acute inflammation. No abdominal fluid collection. Normal appendix. Vascular/Lymphatic: There is atherosclerotic calcification of the non aneurysmal abdominal aorta. No abdominal or pelvic adenopathy. Reproductive: Status post hysterectomy.  No adnexal mass. Musculoskeletal: Severe lower lumbar facet arthrosis. No spinal canal stenosis. No lytic or blastic lesions.  Normal visualized extrathoracic and extraperitoneal soft tissues. Other: No contributory non-categorized findings. IMPRESSION: 1. Multiple to hypoattenuating lesions within the liver, predominantly in the hepatic dome. The the density measures greater than would be expected for simple cystic lesions. Otherwise, these lesions are incompletely evaluated in the absence of IV contrast and are concerning for neoplastic process. MRI of the abdomen with and without contrast is recommended, if there are no contraindications. Otherwise, a multiphase hepatic CT would be an alternative. 2. Massive hepatic cyst measuring up to 19 cm. 3. Bilateral nonobstructing nephrolithiasis. 4. Cholelithiasis. Electronically Signed   By: Ulyses Jarred M.D.   On: 05/25/2016 17:57    Patient's labs are still pending. The patient's blood sugar we know is at least greater than 600. She's been noncompliant with her long-acting insulin regiment.  Patient stopped it because she thinks that her right upper quadrant pain that she's having is related to that. But she stopped insulin more than 3 weeks ago and the pain is continued.  I will attempt to get her blood sugars under control here. Patient given some subcutaneous long-acting insulin and will follow-up her blood sugars. If we have difficulty Control she may require admission. On exam patient is alert and oriented heart regular lungs are clear bilaterally abdomen nondistended nontender.  We think the patient's right upper quadrant pain this been present for a while may be secondary to a liver cyst that is known and she has an MRI pending.   Fredia Sorrow, MD 05/31/16 1710

## 2016-05-31 NOTE — ED Provider Notes (Signed)
[facility] DEPT Provider Note   CSN: [id number] Arrival date & time: 05/31/16  1457     History   Chief Complaint Chief Complaint  Patient presents with  . Hyperglycemia    HPI Samantha Norton is a 83 y.o. female with a PMHx of anemia, thrombocytopenia, DM2, HLD, HTN, osteoporosis, and GERD, who presents to the ED via EMS after her sister-in-law came and checked on her today and noticed that her blood sugar was "high" so she made her come to the hospital. Patient states that she hasn't taken her Lantus and more than 1 month because she is convinced that it caused her to have right upper quadrant discomfort/fullness. She has been worked up as an outpatient by her PCP for her RUQ pain, including a CT scan on 05/25/16 that showed a large liver cyst and lesion and therefore she has an MRI scheduled; however, she was told at her last outpatient visit on 05/26/16 that she needed to restart her Lantus 8 units every morning because her blood sugar was 370 on the BMP from 05/25/16. She has not restarted it because she doesn't want to. She has noticed polydipsia and polyuria however she denies any other complaints at this time. She denies fevers, chills, CP, SOB, worsening/changing abd pain, N/V/D/C, hematochezia, melena, hematuria, dysuria, myalgias, arthralgias, numbness, tingling, focal weakness, vision changes, lightheadedness, or any other complaints at this time. She continues to be compliant with her other meds, including taking her HTN meds this morning. She does not check her sugars ever.   The history is provided by the patient and medical records. No language interpreter was used.  Hyperglycemia  Blood sugar level PTA:  437 Severity:  Moderate Onset quality:  Gradual Timing:  Constant Progression:  Unchanged Chronicity:  Chronic Diabetes status:  Controlled with insulin (however noncompliant on Lantus) Current diabetic therapy:  Lantus 8U qAM Time since last antidiabetic medication:  1  month Context: noncompliance   Relieved by:  None tried Ineffective treatments:  None tried Associated symptoms: increased thirst and polyuria   Associated symptoms: no abdominal pain (chronic, unchanged), no chest pain, no confusion, no dysuria, no fever, no nausea, no shortness of breath, no vomiting and no weakness     Past Medical History:  Diagnosis Date  . Anemia    thrombocytopenia, seen by hematology.  . Diabetes mellitus    no meds  . Diverticulosis of colon   . GERD (gastroesophageal reflux disease)    no medications. Last EGD 2+ years ago. Had H. Pylori  . Hyperlipidemia   . Hypertension   . Osteoporosis    by patient report - osteopenia  . Varicella    in high school  . Wears dentures    top  . Wears glasses     Patient Active Problem List   Diagnosis Date Noted  . Liver lesion 05/26/2016  . Abdominal pain 05/25/2016  . Diabetes ([place]) 03/02/2016  . Fatigue 01/12/2016  . Health care maintenance 01/31/2015  . Borderline type 2 diabetes mellitus 01/31/2015  . Dupuytren contracture 01/05/2013  . Hypertension   . Hyperlipidemia   . Anemia     Past Surgical History:  Procedure Laterality Date  . ABDOMINAL HYSTERECTOMY     partial, due to fibroid tumors.  Marland Kitchen BREAST LUMPECTOMY     bilateral biospsies over time x 3-4 , always beng  . FASCIOTOMY Right 03/14/2013   Procedure: FASCIOTOMY RIGHT RING FINGER;  Surgeon: Wynonia Sours, MD;  Location: [facility];  Service: Orthopedics;  Laterality: Right;  . tonsilectomy      OB History    No data available       Home Medications    Prior to Admission medications   Medication Sig Start Date End Date Taking? Authorizing Provider  B Complex Vitamins (B COMPLEX 100 PO) Take 1 tablet by mouth daily.    Historical Provider, MD  blood glucose meter kit and supplies KIT Dispense based on patient and insurance preference. Use up to four times daily as directed. (FOR ICD-9 250.00, 250.01). 03/01/16    Hanford N Rumley, DO  calcium-vitamin D (OSCAL WITH D) 250-125 MG-UNIT tablet Take 1 tablet by mouth daily. 01/12/16   Verner Mould, MD  FLUVIRIN PRESERVATIVE FREE SUSP injection  04/11/12   Historical Provider, MD  Insulin Glargine (LANTUS) 100 UNIT/ML Solostar Pen Inject 8 Units into the skin daily with breakfast. 03/11/16   Zenia Resides, MD  irbesartan-hydrochlorothiazide (AVALIDE) 150-12.5 MG tablet TAKE 2 TABLETS DAILY Patient taking differently: 1 tablet 2 (two) times daily. TAKE 2 TABLETS DAILY 01/12/16   Verner Mould, MD  multivitamin [facility]) per tablet Take 1 tablet by mouth daily.      Historical Provider, MD  Olopatadine HCl (PATADAY) 0.2 % SOLN Place 1 drop into both eyes daily as needed. 01/12/16   Verner Mould, MD  ONE TOUCH ULTRA TEST test strip 1 each by Other route 3 (three) times daily. 03/29/16   Verner Mould, MD  [facility] DELICA LANCETS 94B MISC Inject 1 Device into the skin daily. 03/02/16   Historical Provider, MD  TURMERIC PO Take 1 tablet by mouth daily.    Historical Provider, MD    Family History Family History  Problem Relation Age of Onset  . Hypertension Mother   . Stroke Mother   . Diabetes Mother   . Stroke Father   . Hypertension Father   . Arthritis Brother     hip problems, in w/c  . Cancer Daughter     breast    Social History Social History  Substance Use Topics  . Smoking status: Former Smoker    Quit date: 02/08/1972  . Smokeless tobacco: Never Used  . Alcohol use Yes     Comment: rare glass of wine     Allergies   Patient has no known allergies.   Review of Systems Review of Systems  Constitutional: Negative for chills and fever.  Eyes: Negative for visual disturbance.  Respiratory: Negative for shortness of breath.   Cardiovascular: Negative for chest pain.  Gastrointestinal: Negative for abdominal pain (chronic, unchanged), blood in stool, constipation, diarrhea, nausea and  vomiting.  Endocrine: Positive for polydipsia and polyuria.  Genitourinary: Negative for dysuria and hematuria.  Musculoskeletal: Negative for arthralgias and myalgias.  Skin: Negative for color change.  Allergic/Immunologic: Positive for immunocompromised state (DM2).  Neurological: Negative for weakness, light-headedness and numbness.  Psychiatric/Behavioral: Negative for confusion.   10 Systems reviewed and are negative for acute change except as noted in the HPI.   Physical Exam Updated Vital Signs BP (!) 188/109   Pulse 71   Temp 97.8 F (36.6 C)   Resp 18   SpO2 99%   Physical Exam  Constitutional: She is oriented to person, place, and time. Vital signs are normal. She appears well-developed and well-nourished.  Non-toxic appearance. No distress.  Afebrile, nontoxic, NAD, HTN noted similar to prior visits  HENT:  Head: Normocephalic and atraumatic.  Mouth/Throat: Oropharynx is  clear and moist. Mucous membranes are dry (mild).  Mildly dry lips  Eyes: Conjunctivae and EOM are normal. Right eye exhibits no discharge. Left eye exhibits no discharge.  Neck: Normal range of motion. Neck supple.  Cardiovascular: Normal rate, regular rhythm, normal heart sounds and intact distal pulses.  Exam reveals no gallop and no friction rub.   No murmur heard. Pulmonary/Chest: Effort normal and breath sounds normal. No respiratory distress. She has no decreased breath sounds. She has no wheezes. She has no rhonchi. She has no rales.  Abdominal: Soft. Normal appearance and bowel sounds are normal. She exhibits no distension. There is no tenderness. There is no rigidity, no rebound, no guarding, no CVA tenderness, no tenderness at McBurney's point and negative Murphy's sign.  Soft, NTND, +BS throughout, no r/g/r, neg murphy's, neg mcburney's, no CVA TTP   Musculoskeletal: Normal range of motion.  Neurological: She is alert and oriented to person, place, and time. She has normal strength. No  sensory deficit.  Skin: Skin is warm, dry and intact. No rash noted.  Psychiatric: She has a normal mood and affect.  Nursing note and vitals reviewed.    ED Treatments / Results  Labs (all labs ordered are listed, but only abnormal results are displayed) Labs Reviewed  URINALYSIS, ROUTINE W REFLEX MICROSCOPIC - Abnormal; Notable for the following:       Result Value   Glucose, UA >=500 (*)    Ketones, ur 20 (*)    Squamous Epithelial / LPF 0-5 (*)    All other components within normal limits  CBC WITH DIFFERENTIAL/PLATELET - Abnormal; Notable for the following:    WBC 14.0 (*)    Hemoglobin 11.3 (*)    HCT 34.7 (*)    Platelets 89 (*)    Neutro Abs 12.1 (*)    Lymphs Abs 0.6 (*)    All other components within normal limits  CBG MONITORING, ED - Abnormal; Notable for the following:    Glucose-Capillary >600 (*)    All other components within normal limits  CBG MONITORING, ED - Abnormal; Notable for the following:    Glucose-Capillary 472 (*)    All other components within normal limits    EKG  EKG Interpretation None       Radiology No results found.  CT abd/pelv w/o contrast 05/25/16 Study Result  CLINICAL DATA:  Nausea and pain and right-sided roots  EXAM: CT ABDOMEN AND PELVIS WITHOUT CONTRAST  TECHNIQUE: Multidetector CT imaging of the abdomen and pelvis was performed following the standard protocol without IV contrast.  COMPARISON:  None.  FINDINGS: Lower chest: No pulmonary nodules. No visible pleural or pericardial effusion. There is a calcified subcarinal lymph node.  Hepatobiliary: There is a large fluid density structure within the liver measuring 19 x 14 cm. There is a smaller cyst in the lower right hepatic lobe measuring 1.3 cm. No biliary dilatation. There are multiple areas of low density in the hepatic dome. There is cholelithiasis without evidence of acute cholecystitis.  Pancreas: Visualization of the pancreas is limited. The  pancreatic tail appears normal.  Spleen: Normal.  Adrenals/Urinary Tract: Normal adrenal glands. There bilateral nonobstructing renal stones measuring up to 3 mm. No hydronephrosis. Hyperdense focus in the anterior right kidney, likely proteinaceous cyst.  Stomach/Bowel: No abnormal bowel dilatation. No bowel wall thickening or adjacent fat stranding to indicate acute inflammation. No abdominal fluid collection. Normal appendix.  Vascular/Lymphatic: There is atherosclerotic calcification of the non aneurysmal abdominal aorta. No abdominal  or pelvic adenopathy.  Reproductive: Status post hysterectomy.  No adnexal mass.  Musculoskeletal: Severe lower lumbar facet arthrosis. No spinal canal stenosis. No lytic or blastic lesions. Normal visualized extrathoracic and extraperitoneal soft tissues.  Other: No contributory non-categorized findings.  IMPRESSION: 1. Multiple to hypoattenuating lesions within the liver, predominantly in the hepatic dome. The the density measures greater than would be expected for simple cystic lesions. Otherwise, these lesions are incompletely evaluated in the absence of IV contrast and are concerning for neoplastic process. MRI of the abdomen with and without contrast is recommended, if there are no contraindications. Otherwise, a multiphase hepatic CT would be an alternative. 2. Massive hepatic cyst measuring up to 19 cm. 3. Bilateral nonobstructing nephrolithiasis. 4. Cholelithiasis.   Electronically Signed   By: Ulyses Jarred M.D.   On: 05/25/2016 17:57     Procedures Procedures (including critical care time)  Medications Ordered in ED Medications  sodium chloride 0.9 % bolus 2,000 mL (0 mLs Intravenous Stopped 05/31/16 1809)  insulin glargine (LANTUS) injection 8 Units (8 Units Subcutaneous Given 05/31/16 1552)     Initial Impression / Assessment and Plan / ED Course  I have reviewed the triage vital signs and the nursing  notes.  Pertinent labs & imaging results that were available during my care of the patient were reviewed by me and considered in my medical decision making (see chart for details).     83 y.o. female here with asymptomatic hyperglycemia. Her sister in law went and checked on her, found it to be "high", and made her come in. Pt  is convinced that her chronic RUQ abd pain is due to taking lantus because it started around the time that she started lantus, so she stopped using her lantus over 79monthago. She has noticed polyuria and polydipsia but otherwise denies any complaints at this time. On exam, no appreciable abdominal TTP, nonperitoneal exam, neg murphy's, mildly dry lips, and HTN noted but otherwise unremarkable exam. Long discussion had regarding importance of lantus use, and how PO alternatives wouldn't likely help, and that lantus is unlikely the cause of her abd pain however hyperglycemia could be contributing, and she ultimately agreed to get the lantus today. CBG >600 here; Will give 8U, which is her home dose. Will get basic labs, U/A, and give fluids. Discussed case with my attending Dr. ZRogene Houstonwho agrees with plan.  Will reassess shortly  5:21 PM U/A with >/=500 glucose, and only a few ketones; doubt DKA; otherwise unremarkable, doubt UTI. Several delays in labs resulting, due to hemolyzed samples; BMP redrawn after lantus given and fluids started. Labs in process and should result shortly. Will reassess shortly.  6:12 PM CBC w/diff with mildly elevated WBC 14.0, and mild anemia. Also with thrombocytopenia at baseline. BMP still not resulted or even in process, despite the fact that lab has been called multiple times. CBG 472, so we have successfully brought down her sugars to a safer level, therefore I will cancel the BMP. Will d/c home with refill of her lantus, stressed the importance of taking all home meds including lantus. Pt's HTN still noted on discharge, pt denies complaints or  symptoms, and she is due for her home BP med, so advised for her to take it when she gets home. F/up with PCP in 3-5 days. DASH diet advised. Stay hydrated. I explained the diagnosis and have given explicit precautions to return to the ER including for any other new or worsening symptoms. The patient understands  and accepts the medical plan as it's been dictated and I have answered their questions. Discharge instructions concerning home care and prescriptions have been given. The patient is STABLE and is discharged to home in good condition.    Final Clinical Impressions(s) / ED Diagnoses   Final diagnoses:  Type 2 diabetes mellitus with hyperglycemia, unspecified long term insulin use status ([place])  Noncompliance with diabetes treatment  Thrombocytopenia ([facility])  Anemia, unspecified type  Essential hypertension    New Prescriptions New Prescriptions   INSULIN GLARGINE (LANTUS) 100 UNIT/ML INJECTION    Inject 0.08 mLs (8 Units total) into the skin daily with breakfast.     [address], PA-C 05/31/16 1813    Fredia Sorrow, MD 06/01/16 [phone]

## 2016-05-31 NOTE — Discharge Instructions (Signed)
Your blood sugar was very high today, we brought it down with some fluids and your home lantus dose, but it's very important that you start taking your lantus EVERY DAY (today's dose was given, so you can give your first dose tomorrow). Stay hydrated. Take all your home medications as directed, including your blood pressure medications. Eat a low salt diet. Follow up with your primary care doctor in 3-5 days for recheck of symptoms and ongoing management. Return to the ER for emergent changes or worsening symptoms.

## 2016-05-31 NOTE — ED Notes (Signed)
ED Provider at bedside. 

## 2016-05-31 NOTE — ED Notes (Signed)
Called lab regarding BMP not being resulted.  Sarah from lab stated she would check on it and that she would try to have it out in 5 minutes.

## 2016-05-31 NOTE — ED Notes (Signed)
Bed: WA09 Expected date:  Expected time:  Means of arrival:  Comments: EMS-hyperglycemia-room 9

## 2016-05-31 NOTE — ED Triage Notes (Signed)
Pt comes via EMS with new onset hyperglycemia.  Been out of her medicine for a month and doesn't like to take it because she doesn't like how it makes her feel.  CBG 437. 20 G left AC. 250 cc given in route. BP 192/108, HR 68, 97% RA, 16 RR in route. Asymptomatic.

## 2016-06-04 ENCOUNTER — Inpatient Hospital Stay: Payer: Federal, State, Local not specified - PPO | Admitting: Family Medicine

## 2016-06-04 ENCOUNTER — Inpatient Hospital Stay: Payer: Federal, State, Local not specified - PPO | Admitting: Student

## 2016-06-04 ENCOUNTER — Emergency Department (HOSPITAL_COMMUNITY): Payer: Medicare Other

## 2016-06-04 ENCOUNTER — Other Ambulatory Visit: Payer: Self-pay

## 2016-06-04 ENCOUNTER — Encounter (HOSPITAL_COMMUNITY): Payer: Self-pay

## 2016-06-04 ENCOUNTER — Ambulatory Visit (HOSPITAL_COMMUNITY)
Admission: RE | Admit: 2016-06-04 | Discharge: 2016-06-04 | Disposition: A | Discharge status: Home / Self Care | Payer: Medicare Other | Source: Ambulatory Visit | Attending: Family Medicine | Admitting: Family Medicine

## 2016-06-04 ENCOUNTER — Ambulatory Visit: Payer: Federal, State, Local not specified - PPO | Admitting: Internal Medicine

## 2016-06-04 ENCOUNTER — Inpatient Hospital Stay (HOSPITAL_COMMUNITY)
Admission: EM | Admit: 2016-06-04 | Discharge: 2016-06-10 | DRG: 175 | Disposition: A | Discharge status: Hospice | Payer: Medicare Other | Attending: Family Medicine | Admitting: Family Medicine

## 2016-06-04 DIAGNOSIS — E872 Acidosis: Secondary | ICD-10-CM | POA: Diagnosis present

## 2016-06-04 DIAGNOSIS — I614 Nontraumatic intracerebral hemorrhage in cerebellum: Secondary | ICD-10-CM | POA: Diagnosis not present

## 2016-06-04 DIAGNOSIS — Z66 Do not resuscitate: Secondary | ICD-10-CM | POA: Diagnosis present

## 2016-06-04 DIAGNOSIS — R0789 Other chest pain: Secondary | ICD-10-CM | POA: Diagnosis present

## 2016-06-04 DIAGNOSIS — R531 Weakness: Secondary | ICD-10-CM

## 2016-06-04 DIAGNOSIS — Z7189 Other specified counseling: Secondary | ICD-10-CM | POA: Diagnosis not present

## 2016-06-04 DIAGNOSIS — C7802 Secondary malignant neoplasm of left lung: Secondary | ICD-10-CM | POA: Diagnosis present

## 2016-06-04 DIAGNOSIS — I619 Nontraumatic intracerebral hemorrhage, unspecified: Secondary | ICD-10-CM | POA: Insufficient documentation

## 2016-06-04 DIAGNOSIS — K219 Gastro-esophageal reflux disease without esophagitis: Secondary | ICD-10-CM | POA: Diagnosis present

## 2016-06-04 DIAGNOSIS — G8191 Hemiplegia, unspecified affecting right dominant side: Secondary | ICD-10-CM | POA: Diagnosis not present

## 2016-06-04 DIAGNOSIS — K8689 Other specified diseases of pancreas: Secondary | ICD-10-CM

## 2016-06-04 DIAGNOSIS — I82433 Acute embolism and thrombosis of popliteal vein, bilateral: Secondary | ICD-10-CM | POA: Diagnosis present

## 2016-06-04 DIAGNOSIS — I82403 Acute embolism and thrombosis of unspecified deep veins of lower extremity, bilateral: Secondary | ICD-10-CM | POA: Diagnosis not present

## 2016-06-04 DIAGNOSIS — C259 Malignant neoplasm of pancreas, unspecified: Secondary | ICD-10-CM | POA: Diagnosis present

## 2016-06-04 DIAGNOSIS — R402143 Coma scale, eyes open, spontaneous, at hospital admission: Secondary | ICD-10-CM | POA: Diagnosis present

## 2016-06-04 DIAGNOSIS — E785 Hyperlipidemia, unspecified: Secondary | ICD-10-CM | POA: Diagnosis present

## 2016-06-04 DIAGNOSIS — M810 Age-related osteoporosis without current pathological fracture: Secondary | ICD-10-CM | POA: Diagnosis present

## 2016-06-04 DIAGNOSIS — E43 Unspecified severe protein-calorie malnutrition: Secondary | ICD-10-CM | POA: Diagnosis present

## 2016-06-04 DIAGNOSIS — Z6822 Body mass index (BMI) 22.0-22.9, adult: Secondary | ICD-10-CM

## 2016-06-04 DIAGNOSIS — Z515 Encounter for palliative care: Secondary | ICD-10-CM | POA: Diagnosis present

## 2016-06-04 DIAGNOSIS — R634 Abnormal weight loss: Secondary | ICD-10-CM | POA: Diagnosis not present

## 2016-06-04 DIAGNOSIS — R4701 Aphasia: Secondary | ICD-10-CM | POA: Diagnosis present

## 2016-06-04 DIAGNOSIS — I6340 Cerebral infarction due to embolism of unspecified cerebral artery: Secondary | ICD-10-CM | POA: Diagnosis not present

## 2016-06-04 DIAGNOSIS — K769 Liver disease, unspecified: Secondary | ICD-10-CM

## 2016-06-04 DIAGNOSIS — D696 Thrombocytopenia, unspecified: Secondary | ICD-10-CM | POA: Diagnosis present

## 2016-06-04 DIAGNOSIS — E1165 Type 2 diabetes mellitus with hyperglycemia: Secondary | ICD-10-CM | POA: Diagnosis present

## 2016-06-04 DIAGNOSIS — D6859 Other primary thrombophilia: Secondary | ICD-10-CM | POA: Diagnosis present

## 2016-06-04 DIAGNOSIS — I2699 Other pulmonary embolism without acute cor pulmonale: Secondary | ICD-10-CM | POA: Diagnosis present

## 2016-06-04 DIAGNOSIS — I639 Cerebral infarction, unspecified: Secondary | ICD-10-CM | POA: Diagnosis not present

## 2016-06-04 DIAGNOSIS — D649 Anemia, unspecified: Secondary | ICD-10-CM | POA: Diagnosis present

## 2016-06-04 DIAGNOSIS — K5730 Diverticulosis of large intestine without perforation or abscess without bleeding: Secondary | ICD-10-CM | POA: Diagnosis present

## 2016-06-04 DIAGNOSIS — I10 Essential (primary) hypertension: Secondary | ICD-10-CM | POA: Diagnosis present

## 2016-06-04 DIAGNOSIS — C7801 Secondary malignant neoplasm of right lung: Secondary | ICD-10-CM | POA: Diagnosis present

## 2016-06-04 DIAGNOSIS — I824Z9 Acute embolism and thrombosis of unspecified deep veins of unspecified distal lower extremity: Secondary | ICD-10-CM | POA: Diagnosis not present

## 2016-06-04 DIAGNOSIS — R402253 Coma scale, best verbal response, oriented, at hospital admission: Secondary | ICD-10-CM | POA: Diagnosis present

## 2016-06-04 DIAGNOSIS — R0602 Shortness of breath: Secondary | ICD-10-CM | POA: Diagnosis not present

## 2016-06-04 DIAGNOSIS — R739 Hyperglycemia, unspecified: Secondary | ICD-10-CM

## 2016-06-04 DIAGNOSIS — I712 Thoracic aortic aneurysm, without rupture: Secondary | ICD-10-CM | POA: Diagnosis present

## 2016-06-04 DIAGNOSIS — R112 Nausea with vomiting, unspecified: Secondary | ICD-10-CM | POA: Diagnosis not present

## 2016-06-04 DIAGNOSIS — R630 Anorexia: Secondary | ICD-10-CM | POA: Diagnosis not present

## 2016-06-04 DIAGNOSIS — I82411 Acute embolism and thrombosis of right femoral vein: Secondary | ICD-10-CM | POA: Diagnosis present

## 2016-06-04 DIAGNOSIS — R609 Edema, unspecified: Secondary | ICD-10-CM | POA: Diagnosis not present

## 2016-06-04 DIAGNOSIS — I63422 Cerebral infarction due to embolism of left anterior cerebral artery: Secondary | ICD-10-CM | POA: Diagnosis not present

## 2016-06-04 DIAGNOSIS — Z87891 Personal history of nicotine dependence: Secondary | ICD-10-CM

## 2016-06-04 DIAGNOSIS — Z794 Long term (current) use of insulin: Secondary | ICD-10-CM

## 2016-06-04 DIAGNOSIS — K869 Disease of pancreas, unspecified: Secondary | ICD-10-CM | POA: Diagnosis not present

## 2016-06-04 DIAGNOSIS — I82443 Acute embolism and thrombosis of tibial vein, bilateral: Secondary | ICD-10-CM | POA: Diagnosis present

## 2016-06-04 DIAGNOSIS — C787 Secondary malignant neoplasm of liver and intrahepatic bile duct: Secondary | ICD-10-CM | POA: Diagnosis not present

## 2016-06-04 DIAGNOSIS — M720 Palmar fascial fibromatosis [Dupuytren]: Secondary | ICD-10-CM | POA: Diagnosis present

## 2016-06-04 DIAGNOSIS — R402363 Coma scale, best motor response, obeys commands, at hospital admission: Secondary | ICD-10-CM | POA: Diagnosis present

## 2016-06-04 DIAGNOSIS — Z90710 Acquired absence of both cervix and uterus: Secondary | ICD-10-CM

## 2016-06-04 DIAGNOSIS — Z79899 Other long term (current) drug therapy: Secondary | ICD-10-CM

## 2016-06-04 LAB — BRAIN NATRIURETIC PEPTIDE: B Natriuretic Peptide: 313.6 pg/mL — ABNORMAL HIGH (ref 0.0–100.0)

## 2016-06-04 LAB — I-STAT TROPONIN, ED
Troponin i, poc: 0 ng/mL (ref 0.00–0.08)
Troponin i, poc: 0 ng/mL (ref 0.00–0.08)

## 2016-06-04 LAB — URINALYSIS, ROUTINE W REFLEX MICROSCOPIC
BACTERIA UA: NONE SEEN
Bilirubin Urine: NEGATIVE
KETONES UR: 20 mg/dL — AB
Leukocytes, UA: NEGATIVE
Nitrite: NEGATIVE
PROTEIN: 100 mg/dL — AB
Specific Gravity, Urine: 1.027 (ref 1.005–1.030)
pH: 6 (ref 5.0–8.0)

## 2016-06-04 LAB — CBC
HEMATOCRIT: 40.1 % (ref 36.0–46.0)
Hemoglobin: 13.2 g/dL (ref 12.0–15.0)
MCH: 27.5 pg (ref 26.0–34.0)
MCHC: 32.9 g/dL (ref 30.0–36.0)
MCV: 83.5 fL (ref 78.0–100.0)
Platelets: 80 10*3/uL — ABNORMAL LOW (ref 150–400)
RBC: 4.8 MIL/uL (ref 3.87–5.11)
RDW: 14.9 % (ref 11.5–15.5)
WBC: 17.3 10*3/uL — ABNORMAL HIGH (ref 4.0–10.5)

## 2016-06-04 LAB — GLUCOSE, CAPILLARY: GLUCOSE-CAPILLARY: 312 mg/dL — AB (ref 65–99)

## 2016-06-04 LAB — COMPREHENSIVE METABOLIC PANEL
ALK PHOS: 275 U/L — AB (ref 38–126)
ALT: 84 U/L — AB (ref 14–54)
ANION GAP: 13 (ref 5–15)
AST: 90 U/L — ABNORMAL HIGH (ref 15–41)
Albumin: 2.6 g/dL — ABNORMAL LOW (ref 3.5–5.0)
BUN: 27 mg/dL — ABNORMAL HIGH (ref 6–20)
CALCIUM: 9.6 mg/dL (ref 8.9–10.3)
CO2: 28 mmol/L (ref 22–32)
CREATININE: 0.94 mg/dL (ref 0.44–1.00)
Chloride: 97 mmol/L — ABNORMAL LOW (ref 101–111)
GFR calc non Af Amer: 55 mL/min — ABNORMAL LOW (ref >60)
Glucose, Bld: 440 mg/dL — ABNORMAL HIGH (ref 65–99)
Potassium: 3.5 mmol/L (ref 3.5–5.1)
SODIUM: 138 mmol/L (ref 135–145)
TOTAL PROTEIN: 6 g/dL — AB (ref 6.5–8.1)
Total Bilirubin: 2.1 mg/dL — ABNORMAL HIGH (ref 0.3–1.2)

## 2016-06-04 LAB — D-DIMER, QUANTITATIVE: D-Dimer, Quant: >20 ug/mL-FEU — ABNORMAL HIGH (ref 0.00–0.50)

## 2016-06-04 LAB — I-STAT CG4 LACTIC ACID, ED
LACTIC ACID, VENOUS: 2.71 mmol/L — AB (ref 0.5–1.9)
Lactic Acid, Venous: 2.79 mmol/L (ref 0.5–1.9)

## 2016-06-04 LAB — CBG MONITORING, ED: Glucose-Capillary: 390 mg/dL — ABNORMAL HIGH (ref 65–99)

## 2016-06-04 LAB — PROTIME-INR
INR: 1.59
PROTHROMBIN TIME: 19.2 s — AB (ref 11.4–15.2)

## 2016-06-04 LAB — LIPASE, BLOOD: LIPASE: 20 U/L (ref 11–51)

## 2016-06-04 MED ORDER — INSULIN ASPART 100 UNIT/ML ~~LOC~~ SOLN
0.0000 [IU] | Freq: Every day | SUBCUTANEOUS | Status: DC
  Administered 2016-06-04: 4 [IU] via SUBCUTANEOUS
  Administered 2016-06-05: 5 [IU] via SUBCUTANEOUS
  Administered 2016-06-07: 2 [IU] via SUBCUTANEOUS

## 2016-06-04 MED ORDER — ACETAMINOPHEN 325 MG PO TABS: 650.0000 mg | ORAL_TABLET | Freq: Four times a day (QID) | ORAL | Status: DC | PRN

## 2016-06-04 MED ORDER — IRBESARTAN 150 MG PO TABS
150.0000 mg | ORAL_TABLET | Freq: Two times a day (BID) | ORAL | Status: DC
  Administered 2016-06-04 – 2016-06-07 (×7): 150 mg via ORAL
  Filled 2016-06-04 (×7): qty 1

## 2016-06-04 MED ORDER — ONDANSETRON HCL 4 MG PO TABS: 4.0000 mg | ORAL_TABLET | Freq: Four times a day (QID) | ORAL | Status: DC | PRN

## 2016-06-04 MED ORDER — INSULIN ASPART 100 UNIT/ML ~~LOC~~ SOLN
0.0000 [IU] | Freq: Three times a day (TID) | SUBCUTANEOUS | Status: DC
  Administered 2016-06-05: 7 [IU] via SUBCUTANEOUS
  Administered 2016-06-05 (×2): 9 [IU] via SUBCUTANEOUS
  Administered 2016-06-06: 2 [IU] via SUBCUTANEOUS
  Administered 2016-06-06: 3 [IU] via SUBCUTANEOUS
  Administered 2016-06-06: 7 [IU] via SUBCUTANEOUS
  Administered 2016-06-07: 3 [IU] via SUBCUTANEOUS

## 2016-06-04 MED ORDER — INSULIN GLARGINE 100 UNIT/ML ~~LOC~~ SOLN
8.0000 [IU] | Freq: Every day | SUBCUTANEOUS | Status: DC
  Administered 2016-06-05: 8 [IU] via SUBCUTANEOUS
  Filled 2016-06-04 (×3): qty 0.08

## 2016-06-04 MED ORDER — SODIUM CHLORIDE 0.9% FLUSH
3.0000 mL | Freq: Two times a day (BID) | INTRAVENOUS | Status: DC
  Administered 2016-06-04 – 2016-06-08 (×7): 3 mL via INTRAVENOUS
  Administered 2016-06-08: 12:00:00 via INTRAVENOUS

## 2016-06-04 MED ORDER — SODIUM CHLORIDE 0.9% FLUSH: 3.0000 mL | INTRAVENOUS | Status: DC | PRN

## 2016-06-04 MED ORDER — HEPARIN (PORCINE) IN NACL 100-0.45 UNIT/ML-% IJ SOLN
900.0000 [IU]/h | INTRAMUSCULAR | Status: DC
  Administered 2016-06-04 – 2016-06-05 (×2): 900 [IU]/h via INTRAVENOUS
  Filled 2016-06-04 (×2): qty 250

## 2016-06-04 MED ORDER — SODIUM CHLORIDE 0.9 % IV BOLUS (SEPSIS)
1000.0000 mL | Freq: Once | INTRAVENOUS | Status: AC
  Administered 2016-06-04: 1000 mL via INTRAVENOUS

## 2016-06-04 MED ORDER — ONDANSETRON HCL 4 MG/2ML IJ SOLN: 4.0000 mg | Freq: Four times a day (QID) | INTRAMUSCULAR | Status: DC | PRN

## 2016-06-04 MED ORDER — SODIUM CHLORIDE 0.9% FLUSH
3.0000 mL | Freq: Two times a day (BID) | INTRAVENOUS | Status: DC
  Administered 2016-06-05 – 2016-06-08 (×4): 3 mL via INTRAVENOUS

## 2016-06-04 MED ORDER — SODIUM CHLORIDE 0.9 % IV SOLN: 250.0000 mL | INTRAVENOUS | Status: DC | PRN

## 2016-06-04 MED ORDER — INSULIN ASPART 100 UNIT/ML ~~LOC~~ SOLN: 5.0000 [IU] | Freq: Once | SUBCUTANEOUS | Status: AC

## 2016-06-04 MED ORDER — IRBESARTAN-HYDROCHLOROTHIAZIDE 150-12.5 MG PO TABS: 1.0000 | ORAL_TABLET | Freq: Two times a day (BID) | ORAL | Status: DC

## 2016-06-04 MED ORDER — HYDROCHLOROTHIAZIDE 12.5 MG PO CAPS
12.5000 mg | ORAL_CAPSULE | Freq: Two times a day (BID) | ORAL | Status: DC
  Administered 2016-06-04 – 2016-06-07 (×7): 12.5 mg via ORAL
  Filled 2016-06-04 (×8): qty 1

## 2016-06-04 MED ORDER — ACETAMINOPHEN 650 MG RE SUPP: 650.0000 mg | Freq: Four times a day (QID) | RECTAL | Status: DC | PRN

## 2016-06-04 MED ORDER — OXYCODONE-ACETAMINOPHEN 5-325 MG PO TABS
ORAL_TABLET | ORAL | Status: AC
  Filled 2016-06-04: qty 1

## 2016-06-04 MED ORDER — POLYETHYLENE GLYCOL 3350 17 G PO PACK: 17.0000 g | PACK | Freq: Every day | ORAL | Status: DC | PRN

## 2016-06-04 MED ORDER — IOPAMIDOL (ISOVUE-370) INJECTION 76%
INTRAVENOUS | Status: AC
  Administered 2016-06-04: 80 mL
  Filled 2016-06-04: qty 100

## 2016-06-04 MED ORDER — ALUM & MAG HYDROXIDE-SIMETH 200-200-20 MG/5ML PO SUSP
15.0000 mL | ORAL | Status: DC | PRN
  Filled 2016-06-04 (×2): qty 30

## 2016-06-04 MED ORDER — ONDANSETRON 4 MG PO TBDP
4.0000 mg | ORAL_TABLET | Freq: Once | ORAL | Status: AC | PRN
  Administered 2016-06-04: 4 mg via ORAL
  Filled 2016-06-04: qty 1

## 2016-06-04 MED ORDER — HEPARIN BOLUS VIA INFUSION
3200.0000 [IU] | Freq: Once | INTRAVENOUS | Status: AC
  Administered 2016-06-04: 3200 [IU] via INTRAVENOUS
  Filled 2016-06-04: qty 3200

## 2016-06-04 NOTE — ED Notes (Signed)
Pt reports swelling X1 month in RLE LLE. Family at bedside reports pt CBG has been running 400s X1 month.

## 2016-06-04 NOTE — Progress Notes (Signed)
[place] for heparin Indication: pulmonary embolus  Heparin Dosing Weight: 54.4 kg   Assessment: 64 yof with SOB, LE swelling x 1 month, d-dimer >20 - found to have a PE. Pharmacy consulted to dose heparin. Not on anticoagulation PTA. Hg wnl, plt 80 on admit (hx of TCP followed by Hematology). No bleed documented.  Goal of Therapy:  Heparin level 0.3-0.7 units/ml Monitor platelets by anticoagulation protocol: Yes   Plan:  Heparin 3200 unit bolus Start heparin at 900 units/h 6h heparin level Daily heparin level/CBC Monitor s/sx bleeding   Elicia Lamp, PharmD, BCPS Clinical Pharmacist 06/04/2016 7:55 PM

## 2016-06-04 NOTE — ED Notes (Signed)
Patient states fasting B.S. This morning was 400+. Patient took 8 units of lantus this morning but did not eat breakfast because she was instructed to be fasting. Patient last took BP medications last night. Patient notes intermittent nausea over the past month. Patient denies fevers, chills, body aches, dizziness, lightheadedness, diarrhea, abdominal pain, chest pain. Patient states has not had emesis until today.

## 2016-06-04 NOTE — ED Provider Notes (Signed)
[facility] DEPT Provider Note   CSN: [id number] Arrival date & time: 06/04/16  1248     History   Chief Complaint Chief Complaint  Patient presents with  . Hypertension  . Emesis    HPI Samantha Norton is a 83 y.o. female with a past medical history significant for diabetes, GERD, retention, hyperlipidemia, and anemia who presents with multiple complaints including nausea, vomiting, chest pain, shortness of breath, urinary frequency, diarrhea, fatigue, and lower extremity edema. Patient is accompanied by family who reports that patient has been having symptoms for approximately one month. Patient was been having nausea but had not had vomiting until today. Patient reports that she has had one month of bilateral lower extremity edema that has been gradually worsening. She reports that she has had intermittent diarrhea that has improved but has continued to have urinary frequency. She denies dysuria. She says that she is having worsening shortness of breath over the last few days and has not had any productive cough. She does however report that she has been having chest discomfort over the last few days. She describes it as central chest pain radiating to the right side. She says that it is exertional. She also says that it is pleuritic. She says her shortness of breath is very exertional well. She describes the pain is moderate, a 4-10 severity.   She reports that she was an outpatient MRI this morning to evaluate a liver cyst but began having nausea vomiting and shortness of breath and chest pain. Patient presents for further evaluation.     HPI  Past Medical History:  Diagnosis Date  . Anemia    thrombocytopenia, seen by hematology.  . Diabetes mellitus    no meds  . Diverticulosis of colon   . GERD (gastroesophageal reflux disease)    no medications. Last EGD 2+ years ago. Had H. Pylori  . Hyperlipidemia   . Hypertension   . Osteoporosis    by patient report - osteopenia    . Varicella    in high school  . Wears dentures    top  . Wears glasses     Patient Active Problem List   Diagnosis Date Noted  . Liver lesion 05/26/2016  . Abdominal pain 05/25/2016  . Diabetes ([place]) 03/02/2016  . Fatigue 01/12/2016  . Health care maintenance 01/31/2015  . Borderline type 2 diabetes mellitus 01/31/2015  . Dupuytren contracture 01/05/2013  . Hypertension   . Hyperlipidemia   . Anemia     Past Surgical History:  Procedure Laterality Date  . ABDOMINAL HYSTERECTOMY     partial, due to fibroid tumors.  Marland Kitchen BREAST LUMPECTOMY     bilateral biospsies over time x 3-4 , always beng  . FASCIOTOMY Right 03/14/2013   Procedure: FASCIOTOMY RIGHT RING FINGER;  Surgeon: Wynonia Sours, MD;  Location: [facility];  Service: Orthopedics;  Laterality: Right;  . tonsilectomy      OB History    No data available       Home Medications    Prior to Admission medications   Medication Sig Start Date End Date Taking? Authorizing Provider  Insulin Glargine (LANTUS) 100 UNIT/ML Solostar Pen Inject 8 Units into the skin daily with breakfast. 03/11/16  Yes Zenia Resides, MD  irbesartan-hydrochlorothiazide (AVALIDE) 150-12.5 MG tablet TAKE 2 TABLETS DAILY Patient taking differently: Take 1 tablet by mouth 2 (two) times daily. TAKE 2 TABLETS DAILY 01/12/16  Yes [place], MD  B Complex Vitamins (  B COMPLEX 100 PO) Take 1 tablet by mouth daily.    Historical Provider, MD  blood glucose meter kit and supplies KIT Dispense based on patient and insurance preference. Use up to four times daily as directed. (FOR ICD-9 250.00, 250.01). 03/01/16   Willits N Rumley, DO  calcium-vitamin D (OSCAL WITH D) 250-125 MG-UNIT tablet Take 1 tablet by mouth daily. 01/12/16   Verner Mould, MD  insulin glargine (LANTUS) 100 UNIT/ML injection Inject 0.08 mLs (8 Units total) into the skin daily with breakfast. 05/31/16 06/30/16  Mercedes Street, PA-C  Olopatadine HCl  (PATADAY) 0.2 % SOLN Place 1 drop into both eyes daily as needed. 01/12/16   Verner Mould, MD  ONE TOUCH ULTRA TEST test strip 1 each by Other route 3 (three) times daily. 03/29/16   Verner Mould, MD  [facility] DELICA LANCETS 26Z MISC Inject 1 Device into the skin daily. 03/02/16   Historical Provider, MD  terconazole (TERAZOL 7) 0.4 % vaginal cream Place 1 applicator vaginally at bedtime.  05/25/16   Historical Provider, MD  TURMERIC PO Take 1 tablet by mouth daily.    Historical Provider, MD    Family History Family History  Problem Relation Age of Onset  . Hypertension Mother   . Stroke Mother   . Diabetes Mother   . Stroke Father   . Hypertension Father   . Arthritis Brother     hip problems, in w/c  . Cancer Daughter     breast    Social History Social History  Substance Use Topics  . Smoking status: Former Smoker    Quit date: 02/08/1972  . Smokeless tobacco: Never Used  . Alcohol use Yes     Comment: rare glass of wine     Allergies   Patient has no known allergies.   Review of Systems Review of Systems  Constitutional: Negative for activity change, chills, diaphoresis, fatigue and fever.  HENT: Negative for congestion and rhinorrhea.   Eyes: Negative for visual disturbance.  Respiratory: Positive for chest tightness and shortness of breath. Negative for cough and stridor.   Cardiovascular: Positive for chest pain and leg swelling. Negative for palpitations.  Gastrointestinal: Positive for diarrhea and nausea. Negative for abdominal distention, abdominal pain, constipation and vomiting.  Genitourinary: Positive for frequency. Negative for difficulty urinating, dysuria, flank pain, hematuria, menstrual problem, pelvic pain, vaginal bleeding and vaginal discharge.  Musculoskeletal: Negative for back pain and neck pain.  Skin: Negative for rash and wound.  Neurological: Negative for dizziness, weakness, light-headedness, numbness and headaches.    Psychiatric/Behavioral: Negative for agitation and confusion.  All other systems reviewed and are negative.    Physical Exam Updated Vital Signs BP 178/99   Pulse (!) 56   Temp 98.2 F (36.8 C) (Oral)   Resp 17   SpO2 97%   Physical Exam  Constitutional: She is oriented to person, place, and time. She appears well-developed and well-nourished. No distress.  HENT:  Head: Normocephalic and atraumatic.  Right Ear: External ear normal.  Left Ear: External ear normal.  Nose: Nose normal.  Mouth/Throat: Oropharynx is clear and moist. No oropharyngeal exudate.  Eyes: Conjunctivae and EOM are normal. Pupils are equal, round, and reactive to light.  Neck: Normal range of motion. Neck supple.  Cardiovascular: Normal rate, regular rhythm and intact distal pulses.   Pulmonary/Chest: Effort normal and breath sounds normal. No stridor. No respiratory distress. She has no wheezes. She exhibits no tenderness.  Abdominal: Soft. Bowel sounds  are normal. She exhibits no distension. There is no tenderness. There is no rebound.  Musculoskeletal: She exhibits edema. She exhibits no tenderness.  Neurological: She is alert and oriented to person, place, and time. She has normal reflexes. No sensory deficit. She exhibits normal muscle tone. Coordination normal.  Skin: Skin is warm. Capillary refill takes less than 2 seconds. Petechiae and rash noted. She is not diaphoretic. No erythema.  Psychiatric: She has a normal mood and affect.  Nursing note and vitals reviewed.    ED Treatments / Results  Labs (all labs ordered are listed, but only abnormal results are displayed) Labs Reviewed  CBC - Abnormal; Notable for the following:       Result Value   WBC 17.3 (*)    Platelets 80 (*)    All other components within normal limits  PROTIME-INR - Abnormal; Notable for the following:    Prothrombin Time 19.2 (*)    All other components within normal limits  D-DIMER, QUANTITATIVE (NOT AT [facility]) -  Abnormal; Notable for the following:    D-Dimer, Quant >20.00 (*)    All other components within normal limits  BRAIN NATRIURETIC PEPTIDE - Abnormal; Notable for the following:    B Natriuretic Peptide 313.6 (*)    All other components within normal limits  COMPREHENSIVE METABOLIC PANEL - Abnormal; Notable for the following:    Chloride 97 (*)    Glucose, Bld 440 (*)    BUN 27 (*)    Total Protein 6.0 (*)    Albumin 2.6 (*)    AST 90 (*)    ALT 84 (*)    Alkaline Phosphatase 275 (*)    Total Bilirubin 2.1 (*)    GFR calc non Af Amer 55 (*)    All other components within normal limits  URINALYSIS, ROUTINE W REFLEX MICROSCOPIC - Abnormal; Notable for the following:    APPearance HAZY (*)    Glucose, UA >=500 (*)    Hgb urine dipstick SMALL (*)    Ketones, ur 20 (*)    Protein, ur 100 (*)    Squamous Epithelial / LPF 0-5 (*)    All other components within normal limits  I-STAT CG4 LACTIC ACID, ED - Abnormal; Notable for the following:    Lactic Acid, Venous 2.71 (*)    All other components within normal limits  CBG MONITORING, ED - Abnormal; Notable for the following:    Glucose-Capillary 390 (*)    All other components within normal limits  I-STAT CG4 LACTIC ACID, ED - Abnormal; Notable for the following:    Lactic Acid, Venous 2.79 (*)    All other components within normal limits  URINE CULTURE  LIPASE, BLOOD  I-STAT TROPOININ, ED  I-STAT TROPOININ, ED    EKG  EKG Interpretation  Date/Time:  Friday June 04 2016 14:54:55 EST Ventricular Rate:  84 PR Interval:    QRS Duration: 86 QT Interval:  439 QTC Calculation: 519 R Axis:   -31 Text Interpretation:  Sinus rhythm Atrial premature complexes Left axis deviation Probable anterior infarct, age indeterminate Prolonged QT interval Baseline wander in lead(s) II III aVF Long QTc No STEMI Confirmed by Sherry Ruffing MD, Bryttani Blew [phone]) on 06/04/2016 5:04:34 PM       Radiology Dg Chest 2 View  Result Date:  06/04/2016 CLINICAL DATA:  Chest pain. EXAM: CHEST  2 VIEW COMPARISON:  03/11/2011 FINDINGS: The cardiomediastinal silhouette is unchanged. Cardiac silhouette is upper limits of normal in size. Anterior eventration of  the right hemidiaphragm is stable to slightly increased. No airspace consolidation, edema, pleural effusion, or pneumothorax is identified. A shaped thoracolumbar scoliosis is partially visualized. IMPRESSION: No active cardiopulmonary disease. Electronically Signed   By: Logan Bores M.D.   On: 06/04/2016 14:06    Procedures Procedures (including critical care time)  CRITICAL CARE Performed by: Gwenyth Allegra Blaire Hodsdon Total critical care time: 35 minutes Critical care time was exclusive of separately billable procedures and treating other patients. Critical care was necessary to treat or prevent imminent or life-threatening deterioration. Critical care was time spent personally by me on the following activities: development of treatment plan with patient and/or surrogate as well as nursing, discussions with consultants, evaluation of patient's response to treatment, examination of patient, obtaining history from patient or surrogate, ordering and performing treatments and interventions, ordering and review of laboratory studies, ordering and review of radiographic studies, pulse oximetry and re-evaluation of patient's condition.   Medications Ordered in ED Medications  iopamidol (ISOVUE-370) 76 % injection (not administered)  ondansetron (ZOFRAN-ODT) disintegrating tablet 4 mg (4 mg Oral Given 06/04/16 1335)  sodium chloride 0.9 % bolus 1,000 mL (1,000 mLs Intravenous New Bag/Given 06/04/16 1722)     Initial Impression / Assessment and Plan / ED Course  I have reviewed the triage vital signs and the nursing notes.  Pertinent labs & imaging results that were available during my care of the patient were reviewed by me and considered in my medical decision making (see chart for  details).     DYAN LABARBERA is a 83 y.o. female with a past medical history significant for diabetes, GERD, retention, hyperlipidemia, and anemia who presents with multiple complaints including nausea, vomiting, chest pain, shortness of breath, urinary frequency, diarrhea, fatigue, and lower extremity edema.  History and exam are seen above.  On exam, patient's lungs were clear. No wheezing or rhonchi. Patient's chest is nontender. Patient did not have significant abdominal tenderness. No focal neurologic deficits. Patient's lower external [place] did have edema and patient had some mild petechiae.   Based on shins complaint of chest pain, shortness of breath, nausea, vomiting, patient will have workup to look for cause of chest pain. Patient will have laboratory testing, EKG, imaging, and d-dimer.  EEG showed prolonged QT but no evidence of acute ischemia. Lactic acid elevated at 2.71. Fluids were ordered however repeat lactic acid was more elevated.   INR was 1.59, BNP 313, she had a leukocytosis of 17.3. Lipase not elevated. CMP showed elevated AST, ALT, alkaline phosphatase. Bilirubin is elevated. D-dimer returned at greater than 20. Initial troponin is negative.   Urinalysis showed no evidence of acute infection.  Given elevated d-dimer, in the setting of chest pain and shortness of breath, patient will have CT PE study to further evaluate for pulmonary embolism. However, given the patient's rising lactic acid, leukocytosis, and that she was having an MRI of her liver to further evaluate cysts, nausea, vomiting, and pain, suspect patient will require admission for further management. Petechiae likely secondary to low platelets at 80. Agents glucose was also elevated at 440. Patient reports that she has not been a chronic diabetic but only has had problems with her sugars for the last few months. She does not feel these are well controlled and may be contributed to symptoms. Patient is not  currently in DKA.   Given the negative chest x-ray, negative urine, and no other acute signs of infection, it is unclear why the patient has a leukocytosis rising  lactic acid despite rehydration. Patient also will need further management of her glucose.  Anticipate admission following CT scan.   CT showed Acute PE . PT will be started on heparin before admission.   PT admitted to [facility] medicine for further management.    Final Clinical Impressions(s) / ED Diagnoses   Final diagnoses:  Other acute pulmonary embolism without acute cor pulmonale (HCC)  Hyperglycemia     Clinical Impression: 1. Other acute pulmonary embolism without acute cor pulmonale ([place])   2. Hyperglycemia     Disposition: Admit to Family medicine      Courtney Paris, MD 06/04/16 2022

## 2016-06-04 NOTE — ED Notes (Signed)
Patient transported to CT 

## 2016-06-04 NOTE — ED Notes (Signed)
Admitting at bedside 

## 2016-06-04 NOTE — ED Notes (Signed)
Pt's CBG: 390, informed Katie-RN.

## 2016-06-04 NOTE — H&P (Signed)
[facility] Admission History and Physical Service Pager: [phone]  Patient name: Samantha Norton Medical record number: [id number] Date of birth: 02-23-1932 Age: 83 y.o. Gender: female  Primary Care Provider: Adin Hector, MD Consultants: none Code Status: DNR- confirmed on admission  Chief Complaint: SOB  Assessment and Plan: Samantha Norton is a 84 y.o. female presenting with SOB and n/v. PMH is significant for HTN, HLD, anemia, T2DM, thrombocytopenia, and liver lesion.   Dyspnea - Multifactorial. Patient found to have acute pulmonary embolism  likely cause of her dyspnea. Elevated D-dimer on admission >20, CTA chest showed small segmental pulmonary emboli in the right lower lobe and left upper lobe. Saturating upper 90's on room air in ED. Normal HR. Hypercoagulable state likely due to malignancy as there were findings on imaging consistent with liver mass and lung nodules. Other etiologies for dyspnea include new onset HF with elevated BNP(313) and lower extremity swelling. Could have heart strain from PE but no signs of this on EKG. Liver lesion could also be contributing to dyspnea as this is pushing against her diaphragm.  -admit to inpatient, attending Dr. Erin Hearing -monitor on telemetry -continuous pulse oximetry -vitals per floor -continue heparin drip -monitor closely for signs of bleeding -oxygen as needed to keep sats > 90% -up with assistance -am CMP, CBC -echo   Liver lesions- Largest measuring 20 cm, likely malignant in etiology per radiology read, evidence of possible metastasis to lungs. Also multiple other hepatic lesions. Elevated alk phos, LFT's, lactic acid on admission. Albumin low. Likely advanced disease.  -MRI abdomen with and without contrast -collect liver cancer marker AFP -am CMP -need to have discussion with patient about findings -consider consult to oncology in morning -consult to palliative care placed -consulted  chaplain  Lower extremity swelling- likely secondary to liver mass compressing vasculature vs new onset HF. No hx of CHF. -will order echo -monitor fluid status -elevate legs and add compression -hold off on diuresis and extra fluids for now -consider lasix in AM  Lactic acidosis- Lactic acid elevated on admission to 2.1 > 2.7. No obvious source of infection. No signs of pneumonia or UTI on imaging/urinalysis. Could be due to malignancy.  -trend LA  Leukocytosis- WBC elevated to 17.3 and has increased from 14 on 2/19. No obvious infection. CXR negative and UA clean.  No medications that could be contributing.  -monitor CBC -monitor for signs of infection  Abnormal CTA Findings- Evidence of 4.1 cm ascending aortic aneurysm. Multiple pulmonary nodules. Multiple ill-defined hypodense liver lesions suspicious for neoplasm with giant hepatic lesion measuring 20 cm. Possible distal esophageal and GE junction thickening; further evaluation with endoscopy could be obtained. -consider endoscopy -recommend follow up annual CTA/MR for aortic aneurysm- monitor closely for signs of bleeding  HTN- elevated in ED to max 204/98. Has not taken BP medication today. On Avalide (irbesartan-HCTZ 150-12.5 BID) at home -continue home medications  -monitor BP -avoid dropping BP too low too quickly with patient's aortic aneurysm  T2DM- Last A1C 14.3 in November 2017. CBG on admission 440.  -Continue Lantus 8U (home dose) -repeat A1C -sensitive SSI -CBGs ACHS  FEN/GI: regular diet  Prophylaxis: heparin drip  Disposition: pending clinical improvement  History of Present Illness:  Samantha Norton is a 83 y.o. female presenting with N/v and SOB.   Was getting outpatient MRI for liver lesions today when SOB came on. Vomited once today. Was sent from MRI to ED. Has felt short of breath over  past couple of months but today was worsened. Reports worsening weakness over same time-frame. Has felt nauseated for past  several months but no vomiting until today. Also has had decreased appetite and has not been eating much. Reports ten pound weight loss over past 1-2 months. Reports abdominal pain under right rib that is worse after eating. Has not taken any medicine for this pain except tried aleve the other night with some relief. Also with LE swelling that came on over last week. Sugars have been high and hard to control past several months. Stopped taking insulin because it made her feel worse.  Most of family lives in [place] but has a son who lives in [place]. She lives alone and is quite independent at baseline.   Review Of Systems: Per HPI with the following additions:  Review of Systems  Constitutional: Positive for malaise/fatigue and weight loss. Negative for chills and fever.  HENT: Negative for congestion and sore throat.   Eyes: Negative for blurred vision.  Respiratory: Positive for shortness of breath. Negative for cough, hemoptysis, sputum production and wheezing.   Cardiovascular: Positive for leg swelling. Negative for chest pain and palpitations.  Gastrointestinal: Positive for abdominal pain, diarrhea, nausea and vomiting. Negative for blood in stool, constipation and melena.  Genitourinary: Negative for dysuria.  Musculoskeletal: Negative for falls.  Neurological: Positive for weakness. Negative for seizures and headaches.  Psychiatric/Behavioral: Negative for substance abuse.    Patient Active Problem List   Diagnosis Date Noted  . Pulmonary embolism ([place]) 06/04/2016  . Hyperglycemia   . Liver lesion 05/26/2016  . Abdominal pain 05/25/2016  . Diabetes ([place]) 03/02/2016  . Fatigue 01/12/2016  . Health care maintenance 01/31/2015  . Borderline type 2 diabetes mellitus 01/31/2015  . Dupuytren contracture 01/05/2013  . Hypertension   . Hyperlipidemia   . Anemia     Past Medical History: Past Medical History:  Diagnosis Date  . Anemia    thrombocytopenia, seen by  hematology.  . Diabetes mellitus    no meds  . Diverticulosis of colon   . GERD (gastroesophageal reflux disease)    no medications. Last EGD 2+ years ago. Had H. Pylori  . Hyperlipidemia   . Hypertension   . Osteoporosis    by patient report - osteopenia  . Varicella    in high school  . Wears dentures    top  . Wears glasses     Past Surgical History: Past Surgical History:  Procedure Laterality Date  . ABDOMINAL HYSTERECTOMY     partial, due to fibroid tumors.  Marland Kitchen BREAST LUMPECTOMY     bilateral biospsies over time x 3-4 , always beng  . FASCIOTOMY Right 03/14/2013   Procedure: FASCIOTOMY RIGHT RING FINGER;  Surgeon: Wynonia Sours, MD;  Location: [facility];  Service: Orthopedics;  Laterality: Right;  . tonsilectomy      Social History: Social History  Substance Use Topics  . Smoking status: Former Smoker    Quit date: 02/08/1972  . Smokeless tobacco: Never Used  . Alcohol use Yes     Comment: rare glass of wine   Additional social history: lives alone, son lives nearby Please also refer to relevant sections of EMR.  Family History: Family History  Problem Relation Age of Onset  . Hypertension Mother   . Stroke Mother   . Diabetes Mother   . Stroke Father   . Hypertension Father   . Arthritis Brother     hip problems,  in w/c  . Cancer Daughter     breast    Allergies and Medications: No Known Allergies No current facility-administered medications on file prior to encounter.    Current Outpatient Prescriptions on File Prior to Encounter  Medication Sig Dispense Refill  . B Complex Vitamins (B COMPLEX 100 PO) Take 1 tablet by mouth daily.    . blood glucose meter kit and supplies KIT Dispense based on patient and insurance preference. Use up to four times daily as directed. (FOR ICD-9 250.00, 250.01). 1 each 0  . calcium-vitamin D (OSCAL WITH D) 250-125 MG-UNIT tablet Take 1 tablet by mouth daily. 30 tablet 11  . Insulin Glargine (LANTUS)  100 UNIT/ML Solostar Pen Inject 8 Units into the skin daily with breakfast.    . irbesartan-hydrochlorothiazide (AVALIDE) 150-12.5 MG tablet TAKE 2 TABLETS DAILY (Patient taking differently: Take 1 tablet by mouth 2 (two) times daily. ) 180 tablet 4  . Olopatadine HCl (PATADAY) 0.2 % SOLN Place 1 drop into both eyes daily as needed. (Patient taking differently: Place 1 drop into both eyes daily as needed (for itchiness). ) 2.5 mL 11  . ONE TOUCH ULTRA TEST test strip 1 each by Other route 3 (three) times daily. 90 each 11  . ONETOUCH DELICA LANCETS 44I MISC Inject 1 Device into the skin daily.    Marland Kitchen terconazole (TERAZOL 7) 0.4 % vaginal cream Place 1 applicator vaginally at bedtime.     . TURMERIC PO Take 1 capsule by mouth daily.     . insulin glargine (LANTUS) 100 UNIT/ML injection Inject 0.08 mLs (8 Units total) into the skin daily with breakfast. (Patient not taking: Reported on 06/04/2016) 2.4 mL 0    Objective: BP 162/82   Pulse 70   Temp 98.2 F (36.8 C) (Oral)   Resp (!) 27   SpO2 95%  Exam: General: pleasant elderly lady sitting up in bed in NAD, thin Eyes: EOMI, PERRLA. No scleral icterus, no conjunctival injection, wearing glasses ENTM: moist mucous membranes, no erythema or exudate noted, o/p clear  Neck: supple, non-tender, no lymphadenopathy Cardiovascular: RRR no MRG Respiratory: CTAB no increased work of breathing Gastrointestinal: hepatomegaly, +BS. Non-tender. Non-distended. No guarding or rebound MSK: +2 pitting edema up to knees bilaterally.  Derm: no rashes or lesions noted over back, legs, stomach Neuro: CN2-12 in tact, 5/5 strength in upper and lower extremities bilaterally. Sensation in tact throughout, A&Ox3 Psych: appropriate mood and affect  Labs and Imaging: Results for orders placed or performed during the hospital encounter of 06/04/16 (from the past 24 hour(s))  CBC     Status: Abnormal   Collection Time: 06/04/16  1:10 PM  Result Value Ref Range   WBC  17.3 (H) 4.0 - 10.5 K/uL   RBC 4.80 3.87 - 5.11 MIL/uL   Hemoglobin 13.2 12.0 - 15.0 g/dL   HCT 40.1 36.0 - 46.0 %   MCV 83.5 78.0 - 100.0 fL   MCH 27.5 26.0 - 34.0 pg   MCHC 32.9 30.0 - 36.0 g/dL   RDW 14.9 11.5 - 15.5 %   Platelets 80 (L) 150 - 400 K/uL  Protime-INR     Status: Abnormal   Collection Time: 06/04/16  2:18 PM  Result Value Ref Range   Prothrombin Time 19.2 (H) 11.4 - 15.2 seconds   INR 1.59   D-dimer, quantitative (not at [facility])     Status: Abnormal   Collection Time: 06/04/16  2:18 PM  Result Value Ref Range  D-Dimer, Quant >20.00 (H) 0.00 - 0.50 ug/mL-FEU  Brain natriuretic peptide     Status: Abnormal   Collection Time: 06/04/16  2:18 PM  Result Value Ref Range   B Natriuretic Peptide 313.6 (H) 0.0 - 100.0 pg/mL  Comprehensive metabolic panel     Status: Abnormal   Collection Time: 06/04/16  2:18 PM  Result Value Ref Range   Sodium 138 135 - 145 mmol/L   Potassium 3.5 3.5 - 5.1 mmol/L   Chloride 97 (L) 101 - 111 mmol/L   CO2 28 22 - 32 mmol/L   Glucose, Bld 440 (H) 65 - 99 mg/dL   BUN 27 (H) 6 - 20 mg/dL   Creatinine, Ser 0.94 0.44 - 1.00 mg/dL   Calcium 9.6 8.9 - 10.3 mg/dL   Total Protein 6.0 (L) 6.5 - 8.1 g/dL   Albumin 2.6 (L) 3.5 - 5.0 g/dL   AST 90 (H) 15 - 41 U/L   ALT 84 (H) 14 - 54 U/L   Alkaline Phosphatase 275 (H) 38 - 126 U/L   Total Bilirubin 2.1 (H) 0.3 - 1.2 mg/dL   GFR calc non Af Amer 55 (L) >60 mL/min   GFR calc Af Amer >60 >60 mL/min   Anion gap 13 5 - 15  Lipase, blood     Status: None   Collection Time: 06/04/16  2:18 PM  Result Value Ref Range   Lipase 20 11 - 51 U/L  I-stat troponin, ED     Status: None   Collection Time: 06/04/16  2:28 PM  Result Value Ref Range   Troponin i, poc 0.00 0.00 - 0.08 ng/mL   Comment 3          I-Stat CG4 Lactic Acid, ED     Status: Abnormal   Collection Time: 06/04/16  2:30 PM  Result Value Ref Range   Lactic Acid, Venous 2.71 (HH) 0.5 - 1.9 mmol/L   Comment NOTIFIED PHYSICIAN   POC CBG,  ED     Status: Abnormal   Collection Time: 06/04/16  4:05 PM  Result Value Ref Range   Glucose-Capillary 390 (H) 65 - 99 mg/dL   Comment 1 Notify RN    Comment 2 Document in Chart   Urinalysis, Routine w reflex microscopic     Status: Abnormal   Collection Time: 06/04/16  4:20 PM  Result Value Ref Range   Color, Urine YELLOW YELLOW   APPearance HAZY (A) CLEAR   Specific Gravity, Urine 1.027 1.005 - 1.030   pH 6.0 5.0 - 8.0   Glucose, UA >=500 (A) NEGATIVE mg/dL   Hgb urine dipstick SMALL (A) NEGATIVE   Bilirubin Urine NEGATIVE NEGATIVE   Ketones, ur 20 (A) NEGATIVE mg/dL   Protein, ur 100 (A) NEGATIVE mg/dL   Nitrite NEGATIVE NEGATIVE   Leukocytes, UA NEGATIVE NEGATIVE   RBC / HPF 0-5 0 - 5 RBC/hpf   WBC, UA 0-5 0 - 5 WBC/hpf   Bacteria, UA NONE SEEN NONE SEEN   Squamous Epithelial / LPF 0-5 (A) NONE SEEN   Hyaline Casts, UA PRESENT   I-stat troponin, ED     Status: None   Collection Time: 06/04/16  4:48 PM  Result Value Ref Range   Troponin i, poc 0.00 0.00 - 0.08 ng/mL   Comment 3          I-Stat CG4 Lactic Acid, ED     Status: Abnormal   Collection Time: 06/04/16  4:50 PM  Result Value Ref Range  Lactic Acid, Venous 2.79 (HH) 0.5 - 1.9 mmol/L   Comment NOTIFIED PHYSICIAN     Dg Chest 2 View  Result Date: 06/04/2016 CLINICAL DATA:  Chest pain. EXAM: CHEST  2 VIEW COMPARISON:  03/11/2011 FINDINGS: The cardiomediastinal silhouette is unchanged. Cardiac silhouette is upper limits of normal in size. Anterior eventration of the right hemidiaphragm is stable to slightly increased. No airspace consolidation, edema, pleural effusion, or pneumothorax is identified. A shaped thoracolumbar scoliosis is partially visualized. IMPRESSION: No active cardiopulmonary disease. Electronically Signed   By: Logan Bores M.D.   On: 06/04/2016 14:06   Ct Angio Chest Pe W And/or Wo Contrast  Result Date: 06/04/2016 CLINICAL DATA:  Shortness of breath chest pain with nausea and vomiting EXAM: CT  ANGIOGRAPHY CHEST WITH CONTRAST TECHNIQUE: Multidetector CT imaging of the chest was performed using the standard protocol during bolus administration of intravenous contrast. Multiplanar CT image reconstructions and MIPs were obtained to evaluate the vascular anatomy. CONTRAST:  80 mL Isovue 370 intravenous COMPARISON:  Chest x-ray 06/04/2016 FINDINGS: Cardiovascular: Satisfactory opacification of the pulmonary arteries to the segmental level. Small filling defects within right lower lobe subsegmental pulmonary artery, series 7, image number 150 through 153, and within left upper lobe subsegmental pulmonary artery, series 7, image number 103. No other filling defects are visualized. There is mild aneurysmal dilatation of the ascending aorta up to 4.1 cm. Atherosclerosis is noted. There is cardiomegaly. No large pericardial effusion is present. Mediastinum/Nodes: Calcified sub- carinal lymph node. Trachea midline. Thyroid grossly unremarkable. The esophagus is nondilated. Possible wall thickening of the distal esophagus/ GE junction. Lungs/Pleura: No acute consolidation. No pleural effusion or pneumothorax. Multiple, greater than 5 small pulmonary nodules within the right upper, lower, and middle lobes. Somewhat ill-defined nodule in the right middle lobe measures 7 mm. Small ground-glass nodule subpleural left lower lobe, series 6, image number 59. Upper Abdomen: Multiple ill-defined hypodense liver lesions. Large hepatic cyst measuring approximately 20 cm. Musculoskeletal: No acute or suspicious bone lesion. Review of the MIP images confirms the above findings. IMPRESSION: 1. Examination is positive for small sub segmental pulmonary emboli in the right lower lobe and left upper lobe. 2. Mild aneurysmal dilatation of the ascending aorta up to 4.1 cm. Recommend annual imaging followup by CTA or MRA. This recommendation follows 2010 ACCF/AHA/AATS/ACR/ASA/SCA/SCAI/SIR/STS/SVM Guidelines for the Diagnosis and  Management of Patients with Thoracic Aortic Disease. Circulation. 2010; 121: Y301-S010 3. Multiple pulmonary nodules. Non-contrast chest CT at 3-6 months is recommended. If the nodules are stable at time of repeat CT, then future CT at 18-24 months (from today's scan) is considered optional for low-risk patients, but is recommended for high-risk patients. This recommendation follows the consensus statement: Guidelines for Management of Incidental Pulmonary Nodules Detected on CT Images: From the Fleischner Society 2017; Radiology 2017; 284:228-243. 4. Multiple ill-defined hypodense liver lesions suspicious for neoplasm. Giant hepatic cyst measuring 20 cm. 5. Possible distal esophageal and GE junction thickening; further evaluation with endoscopy could be obtained. Critical Value/emergent results were called by telephone at the time of interpretation on 06/04/2016 at 7:21 pm to Dr. Jola Schmidt , who verbally acknowledged these results. Electronically Signed   By: Donavan Foil M.D.   On: 06/04/2016 19:22    Steve Rattler, DO 06/04/2016, 8:43 PM PGY-1, [place] Intern pager: [phone], text pages welcome  FPTS Upper-Level Resident Addendum  I have independently interviewed and examined the patient. I have discussed the above with the original author and agree  with their documentation. My edits for correction/addition/clarification are in pink. Please see also any attending notes.   Katheren Shams, DO PGY-3, [facility] pager: [phone] (text pages welcome through [facility])

## 2016-06-04 NOTE — ED Notes (Signed)
ED Provider at bedside. 

## 2016-06-04 NOTE — ED Triage Notes (Signed)
Pt presents for evaluation of new onset emesis today and HTN. Pt was in MRI department for scheduled abd MRI by PCP for concern over liver cyst. Pt reports not being able to complete MRI due to symptoms. Son reports pt was seen at [facility] ED for similar and hyperglycemia on Monday. Pt reports 1 episode of emesis with persistent nausea.

## 2016-06-04 NOTE — ED Notes (Signed)
CT notified of IV placement. 

## 2016-06-05 ENCOUNTER — Encounter (HOSPITAL_COMMUNITY): Payer: Federal, State, Local not specified - PPO

## 2016-06-05 ENCOUNTER — Inpatient Hospital Stay (HOSPITAL_COMMUNITY): Payer: Medicare Other

## 2016-06-05 DIAGNOSIS — I2699 Other pulmonary embolism without acute cor pulmonale: Secondary | ICD-10-CM

## 2016-06-05 LAB — COMPREHENSIVE METABOLIC PANEL
ALT: 70 U/L — ABNORMAL HIGH (ref 14–54)
ANION GAP: 9 (ref 5–15)
AST: 73 U/L — ABNORMAL HIGH (ref 15–41)
Albumin: 2.2 g/dL — ABNORMAL LOW (ref 3.5–5.0)
Alkaline Phosphatase: 232 U/L — ABNORMAL HIGH (ref 38–126)
BUN: 27 mg/dL — ABNORMAL HIGH (ref 6–20)
CHLORIDE: 100 mmol/L — AB (ref 101–111)
CO2: 31 mmol/L (ref 22–32)
CREATININE: 0.97 mg/dL (ref 0.44–1.00)
Calcium: 9 mg/dL (ref 8.9–10.3)
GFR, EST NON AFRICAN AMERICAN: 53 mL/min — AB (ref >60)
Glucose, Bld: 390 mg/dL — ABNORMAL HIGH (ref 65–99)
POTASSIUM: 3.2 mmol/L — AB (ref 3.5–5.1)
SODIUM: 140 mmol/L (ref 135–145)
Total Bilirubin: 1.7 mg/dL — ABNORMAL HIGH (ref 0.3–1.2)
Total Protein: 5.3 g/dL — ABNORMAL LOW (ref 6.5–8.1)

## 2016-06-05 LAB — CBC
HEMATOCRIT: 35 % — AB (ref 36.0–46.0)
HEMOGLOBIN: 11.7 g/dL — AB (ref 12.0–15.0)
MCH: 28 pg (ref 26.0–34.0)
MCHC: 33.4 g/dL (ref 30.0–36.0)
MCV: 83.7 fL (ref 78.0–100.0)
Platelets: 56 10*3/uL — ABNORMAL LOW (ref 150–400)
RBC: 4.18 MIL/uL (ref 3.87–5.11)
RDW: 15.3 % (ref 11.5–15.5)
WBC: 16.6 10*3/uL — AB (ref 4.0–10.5)

## 2016-06-05 LAB — URINE CULTURE

## 2016-06-05 LAB — TSH: TSH: 1.201 u[IU]/mL (ref 0.350–4.500)

## 2016-06-05 LAB — ECHOCARDIOGRAM COMPLETE
HEIGHTINCHES: 61 in
WEIGHTICAEL: 1932.8 [oz_av]

## 2016-06-05 LAB — LACTIC ACID, PLASMA
LACTIC ACID, VENOUS: 1.7 mmol/L (ref 0.5–1.9)
Lactic Acid, Venous: 2.4 mmol/L (ref 0.5–1.9)

## 2016-06-05 LAB — PREALBUMIN: Prealbumin: <5 mg/dL — ABNORMAL LOW (ref 18–38)

## 2016-06-05 LAB — HEPARIN LEVEL (UNFRACTIONATED)
HEPARIN UNFRACTIONATED: 0.49 [IU]/mL (ref 0.30–0.70)
Heparin Unfractionated: 0.18 IU/mL — ABNORMAL LOW (ref 0.30–0.70)

## 2016-06-05 MED ORDER — GADOBENATE DIMEGLUMINE 529 MG/ML IV SOLN
11.0000 mL | Freq: Once | INTRAVENOUS | Status: AC | PRN
  Administered 2016-06-05: 11 mL via INTRAVENOUS

## 2016-06-05 MED ORDER — INSULIN ASPART 100 UNIT/ML ~~LOC~~ SOLN
1.0000 [IU] | Freq: Once | SUBCUTANEOUS | Status: AC
  Administered 2016-06-05: 1 [IU] via SUBCUTANEOUS

## 2016-06-05 MED ORDER — APIXABAN 5 MG PO TABS: 5.0000 mg | ORAL_TABLET | Freq: Two times a day (BID) | ORAL | Status: DC

## 2016-06-05 MED ORDER — APIXABAN 5 MG PO TABS
10.0000 mg | ORAL_TABLET | Freq: Two times a day (BID) | ORAL | Status: DC
  Administered 2016-06-05 – 2016-06-06 (×3): 10 mg via ORAL
  Filled 2016-06-05 (×3): qty 2

## 2016-06-05 MED ORDER — POTASSIUM CHLORIDE CRYS ER 20 MEQ PO TBCR
80.0000 meq | EXTENDED_RELEASE_TABLET | Freq: Once | ORAL | Status: AC
  Administered 2016-06-05: 80 meq via ORAL
  Filled 2016-06-05: qty 4

## 2016-06-05 MED ORDER — INSULIN ASPART 100 UNIT/ML ~~LOC~~ SOLN
11.0000 [IU] | Freq: Once | SUBCUTANEOUS | Status: AC
  Administered 2016-06-05: 11 [IU] via SUBCUTANEOUS

## 2016-06-05 MED ORDER — RIVAROXABAN 15 MG PO TABS: 15.0000 mg | ORAL_TABLET | Freq: Two times a day (BID) | ORAL | Status: DC

## 2016-06-05 MED ORDER — ENSURE ENLIVE PO LIQD
237.0000 mL | Freq: Two times a day (BID) | ORAL | Status: DC
  Administered 2016-06-05 – 2016-06-08 (×5): 237 mL via ORAL

## 2016-06-05 NOTE — Progress Notes (Signed)
Family Medicine Teaching Service Daily Progress Note Intern Pager: [phone]  Patient name: Samantha Norton Medical record number: [id number] Date of birth: 07-30-1931 Age: 83 y.o. Gender: female  Primary Care Provider: Adin Hector, MD Consultants: Palliative Code Status: DNR  Pt Overview and Major Events to Date:  2/23 - Admitted with dyspnea and n/v  Assessment and Plan: Samantha Norton is a 83 y.o. female presenting with SOB and n/v. PMH is significant for HTN, HLD, anemia, T2DM, thrombocytopenia, and liver lesion.   Dyspnea - Multifactorial. Patient found to have acute pulmonary embolism  likely cause of her dyspnea. Elevated D-dimer on admission >20, CTA chest showed small segmental pulmonary emboli in the right lower lobe and left upper lobe. Saturating upper 90's on room air in ED. Normal HR. Hypercoagulable state likely due to malignancy as there were findings on imaging consistent with liver mass and lung nodules. Other etiologies for dyspnea include new onset HF with elevated BNP(313) and lower extremity swelling. Could have heart strain from PE but no signs of this on EKG. Liver lesion could also be contributing to dyspnea as this is pushing against her diaphragm.  -monitor on telemetry -continuous pulse oximetry -vitals per floor -d/c heparin gtt and switch to Xarelto -monitor closely for signs of bleeding -oxygen as needed to keep sats > 90% -up with assistance -am CMP, CBC -echo   Liver lesions- Largest measuring 20 cm, likely malignant in etiology per radiology read, evidence of possible metastasis to lungs. Also multiple other hepatic lesions. Elevated alk phos, LFT's, lactic acid on admission. Albumin low. Likely advanced disease.  -MRI abdomen with and without contrast -collect liver cancer marker AFP -need to have discussion with patient about findings -consult to palliative care, oncology, and chaplin per patient wishes  Lower extremity swelling- likely  secondary to liver mass compressing vasculature vs new onset HF. No hx of CHF. -echo pending -monitor fluid status -elevate legs and add compression -hold off on diuresis and extra fluids for now  Lactic acidosis- Lactic acid elevated on admission to 2.1 > 2.7>1.7. No obvious source of infection. No signs of pneumonia or UTI on imaging/urinalysis. Could be due to malignancy.  -Resolved  Leukocytosis- WBC elevated to 17.3 on admission. This AM 16.6. No obvious infection. CXR negative and UA clean.  No medications that could be contributing.  -monitor CBC -monitor for signs of infection  Abnormal CTA Findings- Evidence of 4.1 cm ascending aortic aneurysm. Multiple pulmonary nodules. Multiple ill-defined hypodense liver lesions suspicious for neoplasm with giant hepatic lesion measuring 20 cm. Possible distal esophageal and GE junction thickening; further evaluation with endoscopy could be obtained. -consider endoscopy -recommend follow up annual CTA/MR for aortic aneurysm- monitor closely for signs of bleeding  HTN- elevated in ED to max 204/98. Has not taken BP medication today. On Avalide (irbesartan-HCTZ 150-12.5 BID) at home -continue home medications  -monitor BP -avoid dropping BP too low too quickly with patient's aortic aneurysm  T2DM- Last A1C 14.3 in November 2017. CBG on admission 440.  -Continue Lantus 8U (home dose) -repeat A1C -sensitive SSI -CBGs ACHS   FEN/GI: regular diet  Prophylaxis: heparin drip --> Xarelto  Disposition: pending clinical improvement and patient wishes  Subjective:  Doing well. No concerns. Denies pain or dyspnea. Continues to have some intermittent nausea.   Objective: Temp:  [98.2 F (36.8 C)-99.4 F (37.4 C)] 98.6 F (37 C) (02/24 0419) Pulse Rate:  [49-82] 68 (02/24 0419) Resp:  [11-27] 18 (02/24 0419) BP: (140-204)/(74-112)  144/80 (02/24 0419) SpO2:  [95 %-100 %] 98 % (02/24 0419) Weight:  [120 lb 12.8 oz (54.8 kg)] 120 lb 12.8  oz (54.8 kg) (02/23 2152) Physical Exam: General: pleasant elderly lady sitting up in bed in NAD, thin, A&Ox3 Cardiovascular: RRR no MRG Respiratory: CTAB no increased work of breathing Gastrointestinal: hepatomegaly, Non-tender. Non-distended. No guarding or rebound MSK: +1 pitting edema up to knees bilaterally.  Psych: appropriate mood and affect  Laboratory:  Recent Labs Lab 05/31/16 1635 06/04/16 1310 06/05/16 0215  WBC 14.0* 17.3* 16.6*  HGB 11.3* 13.2 11.7*  HCT 34.7* 40.1 35.0*  PLT 89* 80* 56*    Recent Labs Lab 06/04/16 1418 06/05/16 0215  NA 138 140  K 3.5 3.2*  CL 97* 100*  CO2 28 31  BUN 27* 27*  CREATININE 0.94 0.97  CALCIUM 9.6 9.0  PROT 6.0* 5.3*  BILITOT 2.1* 1.7*  ALKPHOS 275* 232*  ALT 84* 70*  AST 90* 73*  GLUCOSE 440* 390*   TSH 1.2 D-dimer >20 BNP 313 I-stat trop neg Lactic Acid 2.71 >>1.7  Imaging/Diagnostic Tests: Dg Chest 2 View  Result Date: 06/04/2016 IMPRESSION: No active cardiopulmonary disease. Electronically Signed   By: Logan Bores M.D.   On: 06/04/2016 14:06   Ct Angio Chest Pe W And/or Wo Contrast  Result Date: 06/04/2016 IMPRESSION: 1. Examination is positive for small sub segmental pulmonary emboli in the right lower lobe and left upper lobe. 2. Mild aneurysmal dilatation of the ascending aorta up to 4.1 cm. Recommend annual imaging followup by CTA or MRA. This recommendation follows 2010 ACCF/AHA/AATS/ACR/ASA/SCA/SCAI/SIR/STS/SVM Guidelines for the Diagnosis and Management of Patients with Thoracic Aortic Disease. Circulation. 2010; 121: Y073-X106 3. Multiple pulmonary nodules. Non-contrast chest CT at 3-6 months is recommended. If the nodules are stable at time of repeat CT, then future CT at 18-24 months (from today's scan) is considered optional for low-risk patients, but is recommended for high-risk patients. This recommendation follows the consensus statement: Guidelines for Management of Incidental Pulmonary Nodules  Detected on CT Images: From the Fleischner Society 2017; Radiology 2017; 284:228-243. 4. Multiple ill-defined hypodense liver lesions suspicious for neoplasm. Giant hepatic cyst measuring 20 cm. 5. Possible distal esophageal and GE junction thickening; further evaluation with endoscopy could be obtained. Critical Value/emergent results were called by telephone at the time of interpretation on 06/04/2016 at 7:21 pm to Dr. Jola Schmidt , who verbally acknowledged these results. Electronically Signed   By: Donavan Foil M.D.   On: 06/04/2016 19:22    Katheren Shams, DO 06/05/2016, 7:14 AM PGY-3, [facility] Intern pager: [phone], text pages welcome

## 2016-06-05 NOTE — Progress Notes (Signed)
Pt arrived to 2w31. Tele box placed, notified CCMD x2. Pt denies any pain or SOB. VSS. Pt and son updated on plan of care. Made NPO at midnight for abdominal MRI in AM. Oriented to room and call light. Will continue to monitor.  Jaymes Graff, RN

## 2016-06-05 NOTE — Progress Notes (Signed)
ANTICOAGULATION CONSULT NOTE - Follow Up Consult  Pharmacy Consult for heparin Indication: pulmonary embolus  Labs:  Recent Labs  06/04/16 1310 06/04/16 1418 06/05/16 0215  HGB 13.2  --  11.7*  HCT 40.1  --  35.0*  PLT 80*  --  56*  LABPROT  --  19.2*  --   INR  --  1.59  --   HEPARINUNFRC  --   --  0.49  CREATININE  --  0.94  --      Assessment/Plan:  83yo female therapeutic on heparin with initial dosing for PE. Will continue gtt at current rate and confirm stable with additional level.   Wynona Neat, PharmD, BCPS  06/05/2016,2:59 AM

## 2016-06-05 NOTE — Progress Notes (Signed)
Patient had BS of 501.  Patient given 9 units of insulin.  Patient asymptomatic.  Will recheck in 30 minutes.  MD notified and instructed to notify with next BS result.

## 2016-06-05 NOTE — Progress Notes (Signed)
Initial Nutrition Assessment  DOCUMENTATION CODES:   Severe malnutrition in context of chronic illness  INTERVENTION:  Continue Ensure Enlive po BID, each supplement provides 350 kcal and 20 grams of protein.   Encouraged patient to try to drink two bottles Ensure daily to prevent further weight loss. Also discussed if patient only wants to drink one bottle per day she can add small snacks between her meals (yogurt, pudding, ice cream) to help meet calorie/protein needs.   NUTRITION DIAGNOSIS:   Malnutrition (Severe) related to chronic illness, cancer and cancer related treatments as evidenced by severe depletion of body fat, severe depletion of muscle mass.  GOAL:   Patient will meet greater than or equal to 90% of their needs  MONITOR:   PO intake, Supplement acceptance, Labs, I & O's, Weight trends  REASON FOR ASSESSMENT:   Malnutrition Screening Tool    ASSESSMENT:   83 year old female with PMHx of HTN, HLD, diverticulosis, GERD, DM type 2 who presented with SOB and N/V. Patient found to have liver lesions with evidence of possible metastasis to lungs, lower extremity swelling, ascending aortic aneurysm.   -Per chart patient likely has metastatic pancreatic cancer. Patient is pending Palliative Medicine consult.   Spoke with patient and family members at bedside. Patient reports her appetite has been poor for a few months now. She reports she is still attempting to eat three meals per day (fish or chicken with vegetables) but is finishing </=50% of the meals. Patient endorses N/V and abdominal pain. Denies constipation/diarrhea. Patient reports she enjoys the Ensure that have been coming.   UBW 130 lbs and patient reports she has been losing weight for a couple months. Per chart patient was 127 lbs on 01/12/2016 and has lost 7 lbs (5.5% body weight) over 4 months, which is not significant for time frame.   Medications reviewed and include: Novolog sliding scale TID with meals  and daily at bedtime, Lantus 8 units daily. HgbA1c 14.3 on 03/01/2016 (previous 6.2-6.5).   Labs reviewed: CBG 312-390, Potassium 3.2, Chloride 100, Alk Phos 323.   Nutrition-Focused physical exam completed. Findings are severe fat depletion, moderate-severe muscle depletion, and mild edema.   Diet Order:  Diet regular Room service appropriate? Yes; Fluid consistency: Thin  Skin:  Reviewed, no issues  Last BM:  06/04/2016  Height:   Ht Readings from Last 1 Encounters:  06/04/16 _0  (1.549 m)    Weight:   Wt Readings from Last 1 Encounters:  06/04/16 120 lb 12.8 oz (54.8 kg)    Ideal Body Weight:  47.7 kg  BMI:  Body mass index is 22.82 kg/m.  Estimated Nutritional Needs:   Kcal:  1645-1920 (30-35 kcal/kg)  Protein:  65-80 grams (1.2-1.5 grams/kg)  Fluid:  1.4 L/day (25 ml/kg)  EDUCATION NEEDS:   Education needs addressed  Willey Blade, MS, RD, LDN Pager: [phone] After Hours Pager: [phone]

## 2016-06-05 NOTE — Consult Note (Signed)
Consultation Note Date: 06/05/2016   Patient Name: Samantha Norton  DOB: May 02, 1931  MRN: [id number]  Age / Sex: 83 y.o., female  PCP: Samantha Mould, MD Referring Physician: Lind Covert, MD  Reason for Consultation: Establishing goals of care  HPI/Patient Profile: 83 y.o. female   admitted on 06/04/2016     Clinical Assessment and Goals of Care:  83 year old lady admitted to the hospital with acute pulmonary embolism bilateral. She is found to have a large cyst in her liver, multiple necrotic lesions inside her liver, possibly also mass in the tail of her pancreas. She has elevated alkaline phosphatase levels. She has a 5.2 cm mass inside her pancreas based on abdominal imaging. She has low platelets. With the concern for working diagnosis of primary pancreatic malignancy with metastatic disease to liver and lung as a high possibility, a palliative consultation has been obtained for goals of care discussions.  [name] resting in bed. Her daughter-in-law is present at the bedside. Patient ate almost 60-80% of her lunch. She liked the ensure. She states at times she feels nauseous. She denies any abdominal discomfort. She does not have constipation. I introduced myself and palliative care as follows: Palliative medicine is specialized medical care for people living with serious illness. It focuses on providing relief from the symptoms and stress of a serious illness. The goal is to improve quality of life for both the patient and the family.  [name] and was living by herself up until the last few weeks when she noticed fatigue and abdominal discomfort. Her son Samantha Norton had to move in with her. Samantha Norton wife is present at the bedside. Patient has several children and grandchildren who live in [place]. Patient has clear insight and understanding into her current  condition. She wishes to see what can be done to help her.  We will follow along  see recommendations below  thank you for the consult  NEXT OF KIN  son Samantha Norton who has been living with the patient for a few weeks Has several children and family members in [place].   SUMMARY OF RECOMMENDATIONS    Agree with already established code status of DNR Await completion of work up and oncology evaluation.  Introduced palliative care as an extra layer of support, assistance with symptom management and disposition planning.  We will follow along. Thank you for the consult.   Code Status/Advance Care Planning:  DNR    Symptom Management:    continue current treatment.   Palliative Prophylaxis:   Bowel Regimen  Additional Recommendations (Limitations, Scope, Preferences):  Full Scope Treatment  Psycho-social/Spiritual:   Desire for further Chaplaincy support:no  Additional Recommendations: Caregiving  Support/Resources  Prognosis:   Unable to determine  Discharge Planning: To Be Determined      Primary Diagnoses: Present on Admission: . Pulmonary embolism ([place])   I have reviewed the medical record, interviewed the patient and family, and examined the patient. The following aspects are pertinent.  Past  Medical History:  Diagnosis Date  . Anemia    thrombocytopenia, seen by hematology.  . Diabetes mellitus    no meds  . Diverticulosis of colon   . GERD (gastroesophageal reflux disease)    no medications. Last EGD 2+ years ago. Had H. Pylori  . Hyperlipidemia   . Hypertension   . Osteoporosis    by patient report - osteopenia  . Varicella    in high school  . Wears dentures    top  . Wears glasses    Social History   Social History  . Marital status: Divorced    Spouse name: N/A  . Number of children: 7  . Years of education: 70   Occupational History  . Postal clerk Retired    retired at [employer] years   Social History Main Topics  . Smoking  status: Former Smoker    Quit date: 02/08/1972  . Smokeless tobacco: Never Used  . Alcohol use Yes     Comment: rare glass of wine  . Drug use: No  . Sexual activity: Not Currently   Other Topics Concern  . None   Social History Narrative   HSG, 1 year [employer] Married '52 - 22 yrs/divorced. 4 drtrs  '52, '54, '58, '60; 3 sons - '59, '62, '66; grandchildren 16; a lot of great-grands 26. Native of [place]. 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    LIves alone. Closest relative - a son in [place]. No futile or heroic measures but not decided about the details of CPR. Would not want long-term mechanical ventilation.    Family History  Problem Relation Age of Onset  . Hypertension Mother   . Stroke Mother   . Diabetes Mother   . Stroke Father   . Hypertension Father   . Arthritis Brother     hip problems, in w/c  . Cancer Daughter     breast   Scheduled Meds: . apixaban  10 mg Oral BID  . [START ON 06/12/2016] apixaban  5 mg Oral BID  . feeding supplement (ENSURE ENLIVE)  237 mL Oral BID BM  . irbesartan  150 mg Oral BID   And  . hydrochlorothiazide  12.5 mg Oral BID  . insulin aspart  0-5 Units Subcutaneous QHS  . insulin aspart  0-9 Units Subcutaneous TID WC  . insulin glargine  8 Units Subcutaneous Q breakfast  . sodium chloride flush  3 mL Intravenous Q12H  . sodium chloride flush  3 mL Intravenous Q12H   Continuous Infusions: PRN Meds:.sodium chloride, acetaminophen **OR** acetaminophen, alum & mag hydroxide-simeth, ondansetron **OR** ondansetron (ZOFRAN) IV, polyethylene glycol, sodium chloride flush Medications Prior to Admission:  Prior to Admission medications   Medication Sig Start Date End Date Taking? Authorizing Provider  B Complex Vitamins (B COMPLEX 100 PO) Take 1 tablet by mouth daily.   Yes Historical Provider, MD  blood glucose meter kit and supplies KIT Dispense based on patient and insurance preference. Use up to four times daily as directed. (FOR  ICD-9 250.00, 250.01). 03/01/16  Yes Pillow N Rumley, DO  calcium-vitamin D (OSCAL WITH D) 250-125 MG-UNIT tablet Take 1 tablet by mouth daily. 01/12/16  Yes Samantha Mould, MD  Insulin Glargine (LANTUS) 100 UNIT/ML Solostar Pen Inject 8 Units into the skin daily with breakfast. 03/11/16  Yes Zenia Resides, MD  irbesartan-hydrochlorothiazide (AVALIDE) 150-12.5 MG tablet TAKE 2 TABLETS DAILY Patient taking differently: Take 1 tablet by mouth 2 (two) times daily.  01/12/16  Yes Samantha Mould, MD  Olopatadine HCl (PATADAY) 0.2 % SOLN Place 1 drop into both eyes daily as needed. Patient taking differently: Place 1 drop into both eyes daily as needed (for itchiness).  01/12/16  Yes Samantha Mould, MD  ONE TOUCH ULTRA TEST test strip 1 each by Other route 3 (three) times daily. 03/29/16  Yes [place], MD  [facility] DELICA LANCETS 49Z MISC Inject 1 Device into the skin daily. 03/02/16  Yes Historical Provider, MD  terconazole (TERAZOL 7) 0.4 % vaginal cream Place 1 applicator vaginally at bedtime.  05/25/16  Yes Historical Provider, MD  TURMERIC PO Take 1 capsule by mouth daily.    Yes Historical Provider, MD  insulin glargine (LANTUS) 100 UNIT/ML injection Inject 0.08 mLs (8 Units total) into the skin daily with breakfast. Patient not taking: Reported on 06/04/2016 05/31/16 06/30/16  [place], PA-C   No Known Allergies Review of Systems + for nausea Denies pain, denies constipation  Physical Exam General: pleasant elderly lady sitting up in   NAD, thin, A&Ox3 Cardiovascular: RRR no MRG Respiratory: CTAB no increased work of breathing Gastrointestinal: hepatomegaly, Non-tender. Non-distended. No guarding or rebound   +1pitting edema    Psych: appropriate mood and affect  Vital Signs: BP (!) 152/79 (BP Location: Left Arm)   Pulse 73   Temp 98.5 F (36.9 C) (Oral)   Resp 18   Ht _0  (1.549 m)   Wt 54.8 kg (120 lb 12.8 oz)   SpO2 99%    BMI 22.82 kg/m  Pain Assessment: No/denies pain   Pain Score: 0-No pain   SpO2: SpO2: 99 % O2 Device:SpO2: 99 % O2 Flow Rate: .   IO: Intake/output summary:   Intake/Output Summary (Last 24 hours) at 06/05/16 1557 Last data filed at 06/05/16 0300  Gross per 24 hour  Intake           111.05 ml  Output                0 ml  Net           111.05 ml    LBM: Last BM Date: 06/04/16 Baseline Weight: Weight: 54.8 kg (120 lb 12.8 oz) Most recent weight: Weight: 54.8 kg (120 lb 12.8 oz)     Palliative Assessment/Data:   Flowsheet Rows   Flowsheet Row Most Recent Value  Intake Tab  Referral Department  Hospitalist  Unit at Time of Referral  Med/Surg Unit  Palliative Care Primary Diagnosis  Cancer  Palliative Care Type  New Palliative care  Reason for referral  Clarify Goals of Care  Date first seen by Palliative Care  06/05/16  Clinical Assessment  Palliative Performance Scale Score  40%  Pain Max last 24 hours  6  Pain Min Last 24 hours  4  Dyspnea Max Last 24 Hours  4  Dyspnea Min Last 24 hours  3  Nausea Max Last 24 Hours  3  Psychosocial & Spiritual Assessment  Palliative Care Outcomes  Patient/Family meeting held?  Yes  Who was at the meeting?  patient daughter in law.   Palliative Care Outcomes  Clarified goals of care      Time In:  1440 Time Out:  1540 Time Total:  60 Greater than 50%  of this time was spent counseling and coordinating care related to the above assessment and plan.  Signed by: Loistine Chance, MD  [phone] Please contact Palliative Medicine Team phone at  [phone] for questions and concerns.  For individual provider: See Shea Evans

## 2016-06-05 NOTE — Progress Notes (Signed)
Pt CBG 539.  Nurse alerted.

## 2016-06-06 ENCOUNTER — Inpatient Hospital Stay (HOSPITAL_COMMUNITY): Payer: Medicare Other

## 2016-06-06 DIAGNOSIS — R609 Edema, unspecified: Secondary | ICD-10-CM

## 2016-06-06 LAB — CBC
HCT: 34.4 % — ABNORMAL LOW (ref 36.0–46.0)
HEMOGLOBIN: 11.8 g/dL — AB (ref 12.0–15.0)
MCH: 28.9 pg (ref 26.0–34.0)
MCHC: 34.3 g/dL (ref 30.0–36.0)
MCV: 84.3 fL (ref 78.0–100.0)
Platelets: 61 10*3/uL — ABNORMAL LOW (ref 150–400)
RBC: 4.08 MIL/uL (ref 3.87–5.11)
RDW: 15.7 % — ABNORMAL HIGH (ref 11.5–15.5)
WBC: 17.3 10*3/uL — ABNORMAL HIGH (ref 4.0–10.5)

## 2016-06-06 LAB — HEMOGLOBIN A1C
Hgb A1c MFr Bld: 12.8 % — ABNORMAL HIGH (ref 4.8–5.6)
Mean Plasma Glucose: 321 mg/dL

## 2016-06-06 LAB — AFP TUMOR MARKER: AFP TUMOR MARKER: 3.9 ng/mL (ref 0.0–8.3)

## 2016-06-06 MED ORDER — INSULIN ASPART 100 UNIT/ML ~~LOC~~ SOLN
6.0000 [IU] | Freq: Once | SUBCUTANEOUS | Status: AC
  Administered 2016-06-06: 6 [IU] via SUBCUTANEOUS

## 2016-06-06 MED ORDER — INSULIN GLARGINE 100 UNIT/ML ~~LOC~~ SOLN
15.0000 [IU] | Freq: Every day | SUBCUTANEOUS | Status: DC
  Administered 2016-06-06: 15 [IU] via SUBCUTANEOUS
  Filled 2016-06-06 (×2): qty 0.15

## 2016-06-06 MED ORDER — INSULIN GLARGINE 100 UNIT/ML ~~LOC~~ SOLN
20.0000 [IU] | Freq: Every day | SUBCUTANEOUS | Status: DC
  Administered 2016-06-07: 20 [IU] via SUBCUTANEOUS
  Filled 2016-06-06 (×3): qty 0.2

## 2016-06-06 MED ORDER — INSULIN GLARGINE 100 UNIT/ML ~~LOC~~ SOLN: 15.0000 [IU] | Freq: Every day | SUBCUTANEOUS | Status: DC

## 2016-06-06 NOTE — Progress Notes (Signed)
VASCULAR LAB PRELIMINARY  PRELIMINARY  PRELIMINARY  PRELIMINARY  Bilateral lower extremity venous duplex completed.    Preliminary report:  There is acute, non occlusive DVT noted in the right common femoral, femoral, popliteal, and posterior tibial veins.  There is acute, occlusive DVT noted in the left posterior tibial and peroneal veins and non occlusive DVT noted in the left popliteal vein.    Called results to [place], RN  Sharion Dove, RVT 06/06/2016, 5:52 PM

## 2016-06-06 NOTE — Progress Notes (Signed)
Family Medicine Teaching Service Daily Progress Note Intern Pager: [phone]  Patient name: Samantha Norton Medical record number: [id number] Date of birth: May 03, 1931 Age: 83 y.o. Gender: female  Primary Care Provider: Adin Hector, MD Consultants: Palliative, chaplain, IR Code Status: DNR  Pt Overview and Major Events to Date:  2/23 - Admitted with dyspnea and n/v  Assessment and Plan: Samantha Norton is a 83 y.o. female presenting with SOB and n/v. PMH is significant for HTN, HLD, anemia, T2DM, thrombocytopenia, and liver lesion.   Dyspnea in setting of Acute PE and probable metastatic cancer to lungs- CTA chest showed small segmental pulmonary emboli in the right lower lobe and left upper lobe. No CHF as Echo normal. Liver lesion could also be contributing to dyspnea as this is pushing against her IVC.  -monitor on telemetry -continuous pulse oximetry -vitals per floor -continue Eliquis for PE -monitor closely for signs of bleeding -oxygen as needed to keep sats > 90% -up with assistance  Pancreatic tail mass, Liver lesions- Largest measuring 20 cm, likely malignant in etiology per radiology read, evidence of possible metastasis to lungs. Also multiple other hepatic lesions. Elevated alk phos, LFT's, lactic acid on admission. Albumin low. Likely advanced disease.  -IR plan to biopsy on 2/26, NPO at midnight -cancer markers pending- CEA, Ca 19-9, AFP -need to have discussion with patient about findings -consult to palliative care, oncology, and chaplin per patient wishes  Lower extremity swelling- secondary to liver mass compressing IVC. Echo with compression of IVC, normal EF, G1DD -monitor fluid status -elevate legs and add compression -hold off on diuresis and extra fluids for now  Lactic acidosis- resolved- Lactic acid elevated on admission to 2.1 > 2.7>1.7. No obvious source of infection. No signs of pneumonia or UTI on imaging/urinalysis. Could be due to malignancy.   -Resolved  Leukocytosis- persistent- WBC elevated to 17.3 on admission. Trend > 16.6 > 17.3.   -continue to monitor CBC -monitor for signs of infection  Abnormal CTA Findings- Evidence of 4.1 cm ascending aortic aneurysm. Multiple pulmonary nodules. Multiple ill-defined hypodense liver lesions suspicious for neoplasm with giant hepatic lesion measuring 20 cm. Possible distal esophageal and GE junction thickening; further evaluation with endoscopy could be obtained. -consider endoscopy in future -recommend follow up annual CTA/MR for aortic aneurysm- monitor closely for signs of bleeding  HTN- stable- On Avalide (irbesartan-HCTZ 150-12.5 BID) at home. BP this morning 144/72 -continue home medications  -monitor BP -avoid dropping BP too low too quickly with patient's aortic aneurysm  T2DM- uncontrolled- Last A1C 14.3 in November 2017. CBG's have been elevated. This morning -increase Lantus to 15 units this morning -repeat A1C pending -sensitive SSI -CBGs ACHS   FEN/GI: regular diet  Prophylaxis: eliquis  Disposition: pending clinical improvement and patient wishes  Subjective:  [name] is doing well this morning, she slept well. Denies pain. Gets uncomfortable if she eats too much. Little appetite. Son at bedside. No questions or concerns at this time.  Objective: Temp:  [98.5 F (36.9 C)-99.4 F (37.4 C)] 99.1 F (37.3 C) (02/25 0325) Pulse Rate:  [72-75] 72 (02/25 0325) Resp:  [18-19] 18 (02/25 0325) BP: (144-176)/(72-84) 144/72 (02/25 0325) SpO2:  [94 %-100 %] 94 % (02/25 0325) Physical Exam: General: elderly lady laying in bed in NAD Cardiovascular: RRR no MRG Respiratory: CTAB no increased work of breathing Gastrointestinal: hepatomegaly, Non-tender. Non-distended. No guarding or rebound MSK: +1 pitting edema up to knees bilaterally. Compression stockings in place. Psych: appropriate mood and  affect  Laboratory:  Recent Labs Lab 06/04/16 1310  06/05/16 0215 06/06/16 0314  WBC 17.3* 16.6* 17.3*  HGB 13.2 11.7* 11.8*  HCT 40.1 35.0* 34.4*  PLT 80* 56* 61*    Recent Labs Lab 06/04/16 1418 06/05/16 0215  NA 138 140  K 3.5 3.2*  CL 97* 100*  CO2 28 31  BUN 27* 27*  CREATININE 0.94 0.97  CALCIUM 9.6 9.0  PROT 6.0* 5.3*  BILITOT 2.1* 1.7*  ALKPHOS 275* 232*  ALT 84* 70*  AST 90* 73*  GLUCOSE 440* 390*   TSH 1.2 D-dimer >20 BNP 313 I-stat trop neg Lactic Acid 2.71 >>1.7  Imaging/Diagnostic Tests:  Echo  Study Conclusions  - Left ventricle: The cavity size was normal. There was mild focal   basal hypertrophy of the septum. Systolic function was mildly   reduced. The estimated ejection fraction was 50%. There is   akinesis of the basal-midinferolateral myocardium. Doppler   parameters are consistent with abnormal left ventricular   relaxation (grade 1 diastolic dysfunction). - Mitral valve: There was severe regurgitation. - Left atrium: The atrium was moderately dilated. - Right ventricle: The cavity size was below normal. Wall thickness   was normal. - Atrial septum: There was an atrial septal aneurysm. - Inferior vena cava: The vessel was small, appearing collapsed,   consistent with low central venous pressure. The vessel appeared   externally compressed. - Impressions: LVEF 50% with inferolateral hypokinesis and severe   MR. There appears to be a very large cystic structure arising   from the liver that is compressing the IVC and the right   ventricle. Suggest dedicated abdominal imaging.  Impressions:  - LVEF 50% with inferolateral hypokinesis and severe MR. There   appears to be a very large cystic structure arising from the   liver that is compressing the IVC and the right ventricle.   Suggest dedicated abdominal imaging.  Dg Chest 2 View  Result Date: 06/04/2016 IMPRESSION: No active cardiopulmonary disease. Electronically Signed   By: Logan Bores M.D.   On: 06/04/2016 14:06   Ct  Angio Chest Pe W And/or Wo Contrast  Result Date: 06/04/2016 IMPRESSION: 1. Examination is positive for small sub segmental pulmonary emboli in the right lower lobe and left upper lobe. 2. Mild aneurysmal dilatation of the ascending aorta up to 4.1 cm. Recommend annual imaging followup by CTA or MRA. This recommendation follows 2010 ACCF/AHA/AATS/ACR/ASA/SCA/SCAI/SIR/STS/SVM Guidelines for the Diagnosis and Management of Patients with Thoracic Aortic Disease. Circulation. 2010; 121: P382-N053 3. Multiple pulmonary nodules. Non-contrast chest CT at 3-6 months is recommended. If the nodules are stable at time of repeat CT, then future CT at 18-24 months (from today's scan) is considered optional for low-risk patients, but is recommended for high-risk patients. This recommendation follows the consensus statement: Guidelines for Management of Incidental Pulmonary Nodules Detected on CT Images: From the Fleischner Society 2017; Radiology 2017; 284:228-243. 4. Multiple ill-defined hypodense liver lesions suspicious for neoplasm. Giant hepatic cyst measuring 20 cm. 5. Possible distal esophageal and GE junction thickening; further evaluation with endoscopy could be obtained. Critical Value/emergent results were called by telephone at the time of interpretation on 06/04/2016 at 7:21 pm to Dr. Jola Schmidt , who verbally acknowledged these results. Electronically Signed   By: Donavan Foil M.D.   On: 06/04/2016 19:22   Mr Abdomen W Wo Contrast  Result Date: 06/05/2016 CLINICAL DATA:  Right-sided abdominal pain and nausea. Hepatic lesion seen on recent noncontrast CT. EXAM: MRI ABDOMEN  WITHOUT AND WITH CONTRAST TECHNIQUE: Multiplanar multisequence MR imaging of the abdomen was performed both before and after the administration of intravenous contrast. CONTRAST:  61m MULTIHANCE GADOBENATE DIMEGLUMINE 529 MG/ML IV SOLN COMPARISON:  CT on 05/25/2016 FINDINGS: Lower chest: No acute findings. Hepatobiliary: A large simple  appearing cyst is seen throughout the left hepatic lobe measuring 20 cm in diameter. A few other small cysts are seen in the liver dome and inferior right hepatic lobe. Multiple hypovascular enhancing masses seen throughout the remainder of the liver, some of which show central necrosis. Largest of these is in the anterior dome of the liver measuring 4.7 x 7.5 cm. These are consistent with diffuse liver metastases. Pancreas: Mass in the pancreatic tail shows peripheral enhancement and central cystic area. This measures 2.6 x 5.2 cm on image 87/902, suspicious for primary pancreatic carcinoma. Spleen: No evidence of splenomegaly. Mild scarring or old infarcts noted in the lateral aspect of spleen. No splenic mass identified. Adrenals/Urinary Tract: Small bilateral renal cysts. No evidence of renal mass or hydronephrosis. Stomach/Bowel: Small hiatal hernia.  Otherwise unremarkable. Vascular/Lymphatic: Evaluation limited by mass effect from the enlarged liver, however no definite pathologically enlarged lymph nodes identified. No abdominal aortic aneurysm. Other:  Diffuse mesenteric and body wall edema. Musculoskeletal:  No suspicious bone lesions identified. IMPRESSION: Multiple hepatic metastases. Several large hepatic cysts are also noted, largest in the left lobe measuring 20 cm. 5.2 cm mass in the pancreatic tail, suspicious for primary pancreatic carcinoma. Electronically Signed   By: JEarle GellM.D.   On: 06/05/2016 09:27   ASteve Rattler DO 06/06/2016, 8:14 AM PGY-1, [place] pager: [phone] text pages welcome

## 2016-06-06 NOTE — Progress Notes (Signed)
FPTS Interim Progress Note  Paged by nursing, patient's bedtime glucose elevated over sliding scale at 457.  Will give one time dose of novolog 6 units.  Everrett Coombe, MD 06/06/2016, 9:55 PM PGY-1, [place] Medicine Service pager [phone]

## 2016-06-06 NOTE — Discharge Summary (Signed)
[facility] Discharge Summary  Patient name: Samantha Norton Medical record number: [id number] Date of birth: 08/10/1931 Age: 84 y.o. Gender: female Date of Admission: 06/04/2016  Date of Discharge: target organ damage  Admitting Physician: Marshall L Chambliss, MD  Primary Care Provider: Abigail J Lancaster, MD Consultants: oncology, palliative care, spiritual care, IR  Indication for Hospitalization: SOB  Discharge Diagnoses/Problem List:  Acute pulmonary embolism Liver cyst Pancreatic tail mass Lung nodules Acute hemorrhagic and ischemic stroke HTN T2DM  Disposition: home with hospice  Discharge Condition: stable  Discharge Exam: see progress note from day of discharge  Brief Hospital Course:   Samantha Norton is an 84 year old female with PMH of T2DM, HTN, liver mass who presented to [facility] ED on 06/04/26 with SOB and nausea/vomiting. She was found to have pulmonary emboli on CTA chest imaging. She was admitted to [facility] for management. She was started on a heparin drip. Patient was hemodynamically stable and did not require oxygen. On imaging it was noted patient had several masses in her liver and evidence of lung nodules. IR was consulted to do a liver biopsy. Oncology was consulted in anticipation of these findings being metastatic cancer. Further imaging revealed a large pancreatic tail mass. Patient was continued on heparin drip for PE. In the early hours of 2/27 patient was noted to be non-verbal and have right leg weakness by nursing staff when helping her to the bathroom. Code stroke was called. Head imaging revealed a 1cm hemorrhagic stroke in the right cerebellum as well as ischemic infarcts. Heparin drip was stopped. A family discussion was had where patient's wishes were discussed. The decision to pursue comfort care was made. Care management was consulted to help family choose home hospice agency. Palliative care was consulted and assisted  family in answering questions and setting expectations. Patient was discharged home with hospice care on 06/10/16.   Issues for Follow Up:  1. Continue home hospice and comfort care.  Significant Procedures: none  Significant Labs and Imaging:   Recent Labs Lab 06/06/16 0314 06/07/16 0222 06/08/16 0459  WBC 17.3* 15.6* 15.0*  HGB 11.8* 10.6* 11.7*  HCT 34.4* 32.5* 35.1*  PLT 61* 81* 59*    Recent Labs Lab 06/04/16 1418 06/05/16 0215 06/07/16 0222  NA 138 140 139  K 3.5 3.2* 3.4*  CL 97* 100* 104  CO2 28 31 29  GLUCOSE 440* 390* 268*  BUN 27* 27* 21*  CREATININE 0.94 0.97 0.86  CALCIUM 9.6 9.0 8.7*  ALKPHOS 275* 232* 253*  AST 90* 73* 87*  ALT 84* 70* 81*  ALBUMIN 2.6* 2.2* 2.0*   Dg Chest 2 View  Result Date: 06/04/2016 CLINICAL DATA:  Chest pain. EXAM: CHEST  2 VIEW COMPARISON:  03/11/2011 FINDINGS: The cardiomediastinal silhouette is unchanged. Cardiac silhouette is upper limits of normal in size. Anterior eventration of the right hemidiaphragm is stable to slightly increased. No airspace consolidation, edema, pleural effusion, or pneumothorax is identified. A shaped thoracolumbar scoliosis is partially visualized. IMPRESSION: No active cardiopulmonary disease. Electronically Signed   By: Allen  Grady M.D.   On: 06/04/2016 14:06   Ct Angio Chest Pe W And/or Wo Contrast  Result Date: 06/04/2016 CLINICAL DATA:  Shortness of breath chest pain with nausea and vomiting EXAM: CT ANGIOGRAPHY CHEST WITH CONTRAST TECHNIQUE: Multidetector CT imaging of the chest was performed using the standard protocol during bolus administration of intravenous contrast. Multiplanar CT image reconstructions and MIPs were obtained to evaluate the vascular   anatomy. CONTRAST:  80 mL Isovue 370 intravenous COMPARISON:  Chest x-ray 06/04/2016 FINDINGS: Cardiovascular: Satisfactory opacification of the pulmonary arteries to the segmental level. Small filling defects within right lower lobe subsegmental  pulmonary artery, series 7, image number 150 through 153, and within left upper lobe subsegmental pulmonary artery, series 7, image number 103. No other filling defects are visualized. There is mild aneurysmal dilatation of the ascending aorta up to 4.1 cm. Atherosclerosis is noted. There is cardiomegaly. No large pericardial effusion is present. Mediastinum/Nodes: Calcified sub- carinal lymph node. Trachea midline. Thyroid grossly unremarkable. The esophagus is nondilated. Possible wall thickening of the distal esophagus/ GE junction. Lungs/Pleura: No acute consolidation. No pleural effusion or pneumothorax. Multiple, greater than 5 small pulmonary nodules within the right upper, lower, and middle lobes. Somewhat ill-defined nodule in the right middle lobe measures 7 mm. Small ground-glass nodule subpleural left lower lobe, series 6, image number 59. Upper Abdomen: Multiple ill-defined hypodense liver lesions. Large hepatic cyst measuring approximately 20 cm. Musculoskeletal: No acute or suspicious bone lesion. Review of the MIP images confirms the above findings. IMPRESSION: 1. Examination is positive for small sub segmental pulmonary emboli in the right lower lobe and left upper lobe. 2. Mild aneurysmal dilatation of the ascending aorta up to 4.1 cm. Recommend annual imaging followup by CTA or MRA. This recommendation follows 2010 ACCF/AHA/AATS/ACR/ASA/SCA/SCAI/SIR/STS/SVM Guidelines for the Diagnosis and Management of Patients with Thoracic Aortic Disease. Circulation. 2010; 121: e266-e369 3. Multiple pulmonary nodules. Non-contrast chest CT at 3-6 months is recommended. If the nodules are stable at time of repeat CT, then future CT at 18-24 months (from today's scan) is considered optional for low-risk patients, but is recommended for high-risk patients. This recommendation follows the consensus statement: Guidelines for Management of Incidental Pulmonary Nodules Detected on CT Images: From the Fleischner  Society 2017; Radiology 2017; 284:228-243. 4. Multiple ill-defined hypodense liver lesions suspicious for neoplasm. Giant hepatic cyst measuring 20 cm. 5. Possible distal esophageal and GE junction thickening; further evaluation with endoscopy could be obtained. Critical Value/emergent results were called by telephone at the time of interpretation on 06/04/2016 at 7:21 pm to Dr. Kevin Campos , who verbally acknowledged these results. Electronically Signed   By: Kim  Fujinaga M.D.   On: 06/04/2016 19:22   Mr Abdomen W Wo Contrast  Result Date: 06/05/2016 CLINICAL DATA:  Right-sided abdominal pain and nausea. Hepatic lesion seen on recent noncontrast CT. EXAM: MRI ABDOMEN WITHOUT AND WITH CONTRAST TECHNIQUE: Multiplanar multisequence MR imaging of the abdomen was performed both before and after the administration of intravenous contrast. CONTRAST:  11mL MULTIHANCE GADOBENATE DIMEGLUMINE 529 MG/ML IV SOLN COMPARISON:  CT on 05/25/2016 FINDINGS: Lower chest: No acute findings. Hepatobiliary: A large simple appearing cyst is seen throughout the left hepatic lobe measuring 20 cm in diameter. A few other small cysts are seen in the liver dome and inferior right hepatic lobe. Multiple hypovascular enhancing masses seen throughout the remainder of the liver, some of which show central necrosis. Largest of these is in the anterior dome of the liver measuring 4.7 x 7.5 cm. These are consistent with diffuse liver metastases. Pancreas: Mass in the pancreatic tail shows peripheral enhancement and central cystic area. This measures 2.6 x 5.2 cm on image 87/902, suspicious for primary pancreatic carcinoma. Spleen: No evidence of splenomegaly. Mild scarring or old infarcts noted in the lateral aspect of spleen. No splenic mass identified. Adrenals/Urinary Tract: Small bilateral renal cysts. No evidence of renal mass or hydronephrosis. Stomach/Bowel: Small hiatal   hernia.  Otherwise unremarkable. Vascular/Lymphatic: Evaluation  limited by mass effect from the enlarged liver, however no definite pathologically enlarged lymph nodes identified. No abdominal aortic aneurysm. Other:  Diffuse mesenteric and body wall edema. Musculoskeletal:  No suspicious bone lesions identified. IMPRESSION: Multiple hepatic metastases. Several large hepatic cysts are also noted, largest in the left lobe measuring 20 cm. 5.2 cm mass in the pancreatic tail, suspicious for primary pancreatic carcinoma. Electronically Signed   By: Earle Gell M.D.   On: 06/05/2016 09:27   Echo  Study Conclusions  - Left ventricle: The cavity size was normal. There was mild focal   basal hypertrophy of the septum. Systolic function was mildly   reduced. The estimated ejection fraction was 50%. There is   akinesis of the basal-midinferolateral myocardium. Doppler   parameters are consistent with abnormal left ventricular   relaxation (grade 1 diastolic dysfunction). - Mitral valve: There was severe regurgitation. - Left atrium: The atrium was moderately dilated. - Right ventricle: The cavity size was below normal. Wall thickness   was normal. - Atrial septum: There was an atrial septal aneurysm. - Inferior vena cava: The vessel was small, appearing collapsed,   consistent with low central venous pressure. The vessel appeared   externally compressed. - Impressions: LVEF 50% with inferolateral hypokinesis and severe   MR. There appears to be a very large cystic structure arising   from the liver that is compressing the IVC and the right   ventricle. Suggest dedicated abdominal imaging.  Impressions:  - LVEF 50% with inferolateral hypokinesis and severe MR. There   appears to be a very large cystic structure arising from the   liver that is compressing the IVC and the right ventricle.   Suggest dedicated abdominal imaging.   Ct Angio Head W Or Wo Contrast  Result Date: 06/08/2016 CLINICAL DATA:  Aphasia.  RIGHT hemiparesis. EXAM: CT ANGIOGRAPHY  HEAD AND NECK TECHNIQUE: Multidetector CT imaging of the head and neck was performed using the standard protocol during bolus administration of intravenous contrast. Multiplanar CT image reconstructions and MIPs were obtained to evaluate the vascular anatomy. Carotid stenosis measurements (when applicable) are obtained utilizing NASCET criteria, using the distal internal carotid diameter as the denominator. CONTRAST:  Isovue 370, 50 mL. COMPARISON:  CT perfusion reported separately. FINDINGS: CTA NECK Aortic arch: Standard branching. Imaged portion shows no evidence of aneurysm or dissection. No significant stenosis of the major arch vessel origins. Right carotid system: Minor atheromatous change at the bifurcation. No evidence of dissection, stenosis (50% or greater) or occlusion. Left carotid system: Minor atheromatous change the bifurcation. No evidence of dissection, stenosis (50% or greater) or occlusion. Vertebral arteries: Dominant LEFT vertebral Diminutive RIGHT vertebral. No evidence of dissection, stenosis (50% or greater) or occlusion. CTA HEAD Anterior circulation: No significant stenosis of the internal carotid arteries. Both middle cerebral arteries are widely patent. There is a high-grade stenosis at the origin of the LEFT callosomarginal artery from the pericallosal artery, LEFT anterior cerebral artery A3 segment. This is nonocclusive. No  aneurysm, or vascular malformation. Posterior circulation: LEFT vertebral dominant contributor to basilar; RIGHT vertebral contributes to PICA. No significant stenosis, proximal occlusion, aneurysm, or vascular malformation. Venous sinuses: As permitted by contrast timing, patent. Anatomic variants: None of significance. Post infusion imaging the head was not performed, but on axial thin-section imaging, RIGHT cerebellar hemorrhage redemonstrated. Review of the MIP images confirms the above findings IMPRESSION: No extracranial or proximal intracranial stenosis of  significance. High-grade nonocclusive stenosis at  the origin of the LEFT callosomarginal artery, LEFT ACA A3 segment. RIGHT cerebellar hemorrhage redemonstrated. Recommend continued surveillance. Electronically Signed   By: John T Curnes M.D.   On: 06/08/2016 08:11   Ct Angio Neck W Or Wo Contrast  Result Date: 06/08/2016 CLINICAL DATA:  Aphasia.  RIGHT hemiparesis. EXAM: CT ANGIOGRAPHY HEAD AND NECK TECHNIQUE: Multidetector CT imaging of the head and neck was performed using the standard protocol during bolus administration of intravenous contrast. Multiplanar CT image reconstructions and MIPs were obtained to evaluate the vascular anatomy. Carotid stenosis measurements (when applicable) are obtained utilizing NASCET criteria, using the distal internal carotid diameter as the denominator. CONTRAST:  Isovue 370, 50 mL. COMPARISON:  CT perfusion reported separately. FINDINGS: CTA NECK Aortic arch: Standard branching. Imaged portion shows no evidence of aneurysm or dissection. No significant stenosis of the major arch vessel origins. Right carotid system: Minor atheromatous change at the bifurcation. No evidence of dissection, stenosis (50% or greater) or occlusion. Left carotid system: Minor atheromatous change the bifurcation. No evidence of dissection, stenosis (50% or greater) or occlusion. Vertebral arteries: Dominant LEFT vertebral Diminutive RIGHT vertebral. No evidence of dissection, stenosis (50% or greater) or occlusion. CTA HEAD Anterior circulation: No significant stenosis of the internal carotid arteries. Both middle cerebral arteries are widely patent. There is a high-grade stenosis at the origin of the LEFT callosomarginal artery from the pericallosal artery, LEFT anterior cerebral artery A3 segment. This is nonocclusive. No  aneurysm, or vascular malformation. Posterior circulation: LEFT vertebral dominant contributor to basilar; RIGHT vertebral contributes to PICA. No significant stenosis,  proximal occlusion, aneurysm, or vascular malformation. Venous sinuses: As permitted by contrast timing, patent. Anatomic variants: None of significance. Post infusion imaging the head was not performed, but on axial thin-section imaging, RIGHT cerebellar hemorrhage redemonstrated. Review of the MIP images confirms the above findings IMPRESSION: No extracranial or proximal intracranial stenosis of significance. High-grade nonocclusive stenosis at the origin of the LEFT callosomarginal artery, LEFT ACA A3 segment. RIGHT cerebellar hemorrhage redemonstrated. Recommend continued surveillance. Electronically Signed   By: John T Curnes M.D.   On: 06/08/2016 08:11   Ct Cerebral Perfusion W Contrast  Result Date: 06/08/2016 CLINICAL DATA:  New onset of aphasia.  RIGHT-sided weakness. EXAM: CT PERFUSION BRAIN TECHNIQUE: Multiphase CT imaging of the brain was performed following IV bolus contrast injection. Subsequent parametric perfusion maps were calculated using RAPID software. CONTRAST:  40 mL Isovue 370. COMPARISON:  Code stroke CT reported earlier today. CTA head neck reported separately. FINDINGS: CT Brain Perfusion Findings: CBF (<30%) Volume: 0mL Perfusion (Tmax>6.0s) volume: 25mL Mismatch Volume: 25mL Infarction/ischemic Location:Medial frontal lobe, LEFT anterior cerebral artery territory. IMPRESSION: LEFT medial frontal lobe ACA territory mismatch, 25 mL as described. Findings discussed with Stroke neurologist at 7:40 a.m. Electronically Signed   By: John T Curnes M.D.   On: 06/08/2016 07:48   Ct Head Code Stroke Wo Contrast  Result Date: 06/08/2016 CLINICAL DATA:  Code stroke. RIGHT-sided weakness and aphasia. Hypertension. No reported recent fall. Patient on anticoagulation for DVT. EXAM: CT HEAD WITHOUT CONTRAST TECHNIQUE: Contiguous axial images were obtained from the base of the skull through the vertex without intravenous contrast. COMPARISON:  None. FINDINGS: Brain: Roughly 1 cm size hemorrhage,  RIGHT cerebellar subcortical white matter, mild surrounding edema, slight extension to the tentorium on the RIGHT. No visible acute stroke, mass lesion, or hydrocephalus. Moderate atrophy. Chronic microvascular ischemic change of the white matter. Vascular: Carotid siphon vascular calcification. No hyperdense vessel. Skull: Normal. Negative for fracture or   focal lesion. Sinuses/Orbits: No acute finding. Other: None. ASPECTS (Alberta Stroke Program Early CT Score) - Ganglionic level infarction (caudate, lentiform nuclei, internal capsule, insula, M1-M3 cortex): 7 - Supraganglionic infarction (M4-M6 cortex): 3 Total score (0-10 with 10 being normal): 10 IMPRESSION: 1. Acute 1 cm hemorrhage RIGHT cerebellum, with some extension to the RIGHT tentorium. This could be spontaneous (due to anticoagulation), or hypertensive. Trauma not excluded. 2. ASPECTS is 10. Critical Value/emergent results were called by telephone at the time of interpretation on 06/08/2016 at 7:05 Am to Dr. ERIC LINDZEN , who verbally acknowledged these results. Electronically Signed   By: John T Curnes M.D.   On: 06/08/2016 07:17    Results/Tests Pending at Time of Discharge: CEA, Ca 19-9  Discharge Medications:  Allergies as of 06/10/2016   No Known Allergies     Medication List    STOP taking these medications   B COMPLEX 100 PO   blood glucose meter kit and supplies Kit   calcium-vitamin D 250-125 MG-UNIT tablet Commonly known as:  OSCAL WITH D   insulin glargine 100 UNIT/ML injection Commonly known as:  LANTUS   Insulin Glargine 100 UNIT/ML Solostar Pen Commonly known as:  LANTUS   irbesartan-hydrochlorothiazide 150-12.5 MG tablet Commonly known as:  AVALIDE   ONE TOUCH ULTRA TEST test strip Generic drug:  glucose blood   ONETOUCH DELICA LANCETS 33G Misc   terconazole 0.4 % vaginal cream Commonly known as:  TERAZOL 7   TURMERIC PO     TAKE these medications   acetaminophen 650 MG suppository Commonly known  as:  TYLENOL Place 1 suppository (650 mg total) rectally every 6 (six) hours as needed for mild pain (or Fever >/= 101).   alum & mag hydroxide-simeth 200-200-20 MG/5ML suspension Commonly known as:  MAALOX/MYLANTA Take 15 mLs by mouth as needed for indigestion or heartburn.   atropine 1 % ophthalmic solution Place 1 drop into both eyes every 4 (four) hours as needed.   feeding supplement (ENSURE ENLIVE) Liqd Take 237 mLs by mouth 2 (two) times daily as needed (if patient wants it).   haloperidol 2 MG/ML solution Commonly known as:  HALDOL Take 1 mL (2 mg total) by mouth every 2 (two) hours as needed for agitation.   LORazepam 2 MG/ML concentrated solution Commonly known as:  LORAZEPAM INTENSOL Take 0.3 mLs (0.6 mg total) by mouth every 2 (two) hours as needed for anxiety.   morphine CONCENTRATE 10 mg / 0.5 ml concentrated solution Take 0.25 mLs (5 mg total) by mouth every 2 (two) hours as needed for severe pain.   Olopatadine HCl 0.2 % Soln Commonly known as:  PATADAY Place 1 drop into both eyes daily as needed. What changed:  reasons to take this   polyethylene glycol packet Commonly known as:  MIRALAX / GLYCOLAX Take 17 g by mouth daily as needed for mild constipation.       Discharge Instructions: Please refer to Patient Instructions section of EMR for full details.  Patient was counseled important signs and symptoms that should prompt return to medical care, changes in medications, dietary instructions, activity restrictions, and follow up appointments.   Follow-Up Appointments: Per hospice.    , MD 06/10/2016, 12:56 PM PGY-1, [facility]  

## 2016-06-07 ENCOUNTER — Telehealth: Payer: Self-pay | Admitting: *Deleted

## 2016-06-07 ENCOUNTER — Ambulatory Visit: Payer: Federal, State, Local not specified - PPO | Admitting: Endocrinology

## 2016-06-07 DIAGNOSIS — I82403 Acute embolism and thrombosis of unspecified deep veins of lower extremity, bilateral: Secondary | ICD-10-CM

## 2016-06-07 DIAGNOSIS — R0602 Shortness of breath: Secondary | ICD-10-CM

## 2016-06-07 DIAGNOSIS — R112 Nausea with vomiting, unspecified: Secondary | ICD-10-CM

## 2016-06-07 DIAGNOSIS — K769 Liver disease, unspecified: Secondary | ICD-10-CM

## 2016-06-07 DIAGNOSIS — R634 Abnormal weight loss: Secondary | ICD-10-CM

## 2016-06-07 DIAGNOSIS — K8689 Other specified diseases of pancreas: Secondary | ICD-10-CM

## 2016-06-07 DIAGNOSIS — C787 Secondary malignant neoplasm of liver and intrahepatic bile duct: Secondary | ICD-10-CM

## 2016-06-07 DIAGNOSIS — R630 Anorexia: Secondary | ICD-10-CM

## 2016-06-07 DIAGNOSIS — K869 Disease of pancreas, unspecified: Secondary | ICD-10-CM

## 2016-06-07 DIAGNOSIS — Z0289 Encounter for other administrative examinations: Secondary | ICD-10-CM

## 2016-06-07 DIAGNOSIS — I2699 Other pulmonary embolism without acute cor pulmonale: Principal | ICD-10-CM

## 2016-06-07 LAB — GLUCOSE, CAPILLARY
GLUCOSE-CAPILLARY: 370 mg/dL — AB (ref 65–99)
GLUCOSE-CAPILLARY: 357 mg/dL — AB (ref 65–99)
GLUCOSE-CAPILLARY: 412 mg/dL — AB (ref 65–99)
GLUCOSE-CAPILLARY: 167 mg/dL — AB (ref 65–99)
GLUCOSE-CAPILLARY: 188 mg/dL — AB (ref 65–99)
GLUCOSE-CAPILLARY: 248 mg/dL — AB (ref 65–99)
GLUCOSE-CAPILLARY: 372 mg/dL — AB (ref 65–99)
GLUCOSE-CAPILLARY: 457 mg/dL — AB (ref 65–99)
Glucose-Capillary: 339 mg/dL — ABNORMAL HIGH (ref 65–99)
Glucose-Capillary: 501 mg/dL (ref 65–99)
Glucose-Capillary: 539 mg/dL (ref 65–99)
Glucose-Capillary: 209 mg/dL — ABNORMAL HIGH (ref 65–99)
Glucose-Capillary: 202 mg/dL — ABNORMAL HIGH (ref 65–99)
Glucose-Capillary: 276 mg/dL — ABNORMAL HIGH (ref 65–99)

## 2016-06-07 LAB — COMPREHENSIVE METABOLIC PANEL
ALBUMIN: 2 g/dL — AB (ref 3.5–5.0)
ALK PHOS: 253 U/L — AB (ref 38–126)
ALT: 81 U/L — ABNORMAL HIGH (ref 14–54)
ANION GAP: 6 (ref 5–15)
AST: 87 U/L — ABNORMAL HIGH (ref 15–41)
BUN: 21 mg/dL — ABNORMAL HIGH (ref 6–20)
CALCIUM: 8.7 mg/dL — AB (ref 8.9–10.3)
CO2: 29 mmol/L (ref 22–32)
Chloride: 104 mmol/L (ref 101–111)
Creatinine, Ser: 0.86 mg/dL (ref 0.44–1.00)
GFR calc non Af Amer: >60 mL/min (ref >60)
GLUCOSE: 268 mg/dL — AB (ref 65–99)
POTASSIUM: 3.4 mmol/L — AB (ref 3.5–5.1)
SODIUM: 139 mmol/L (ref 135–145)
TOTAL PROTEIN: 4.8 g/dL — AB (ref 6.5–8.1)
Total Bilirubin: 1.5 mg/dL — ABNORMAL HIGH (ref 0.3–1.2)

## 2016-06-07 LAB — CBC
HCT: 32.5 % — ABNORMAL LOW (ref 36.0–46.0)
Hemoglobin: 10.6 g/dL — ABNORMAL LOW (ref 12.0–15.0)
MCH: 27.6 pg (ref 26.0–34.0)
MCHC: 32.6 g/dL (ref 30.0–36.0)
MCV: 84.6 fL (ref 78.0–100.0)
Platelets: 81 10*3/uL — ABNORMAL LOW (ref 150–400)
RBC: 3.84 MIL/uL — ABNORMAL LOW (ref 3.87–5.11)
RDW: 15.9 % — ABNORMAL HIGH (ref 11.5–15.5)
WBC: 15.6 10*3/uL — ABNORMAL HIGH (ref 4.0–10.5)

## 2016-06-07 LAB — APTT: APTT: 118 s — AB (ref 24–36)

## 2016-06-07 MED ORDER — INSULIN ASPART 100 UNIT/ML ~~LOC~~ SOLN
0.0000 [IU] | Freq: Three times a day (TID) | SUBCUTANEOUS | Status: DC
  Administered 2016-06-07: 8 [IU] via SUBCUTANEOUS
  Administered 2016-06-07: 11 [IU] via SUBCUTANEOUS
  Administered 2016-06-08: 3 [IU] via SUBCUTANEOUS

## 2016-06-07 MED ORDER — HEPARIN (PORCINE) IN NACL 100-0.45 UNIT/ML-% IJ SOLN
900.0000 [IU]/h | INTRAMUSCULAR | Status: DC
  Administered 2016-06-07: 900 [IU]/h via INTRAVENOUS
  Filled 2016-06-07: qty 250

## 2016-06-07 MED ORDER — HEPARIN (PORCINE) IN NACL 100-0.45 UNIT/ML-% IJ SOLN: 700.0000 [IU]/h | INTRAMUSCULAR | Status: DC

## 2016-06-07 NOTE — Progress Notes (Signed)
Family Medicine Teaching Service Daily Progress Note Intern Pager: [phone]  Patient name: Samantha Norton Medical record number: [id number] Date of birth: 09-16-1931 Age: 83 y.o. Gender: female  Primary Care Provider: Adin Hector, MD Consultants: Palliative, chaplain, IR Code Status: DNR  Pt Overview and Major Events to Date:  2/23 - Admitted with dyspnea and n/v  Assessment and Plan: Samantha Norton is a 83 y.o. female presenting with SOB and n/v. PMH is significant for HTN, HLD, anemia, T2DM, thrombocytopenia, and liver lesion.   Dyspnea in setting of Acute PE and probable metastatic cancer to lungs- improving- CTA chest showed small segmental pulmonary emboli in the right lower lobe and left upper lobe. No CHF as Echo normal. Liver lesion could also be contributing to dyspnea as this is pushing against her IVC.  -monitor on telemetry with continuous pulse ox -vitals per floor -switch to heparin drip for PE, hold this 2/27 at midnight in anticipation of biopsy 2/28 am -monitor closely for signs of bleeding -up with assistance  Pancreatic tail mass, Liver lesions- Largest measuring 20 cm, likely malignant in etiology per radiology read, evidence of possible metastasis to lungs. Also multiple other hepatic lesions. Elevated alk phos, LFT's, lactic acid on admission. Albumin low. Likely advanced disease. AFP normal.  -IR plan to biopsy am of 2/28, hold heparin drip prior, NPO midnight -cancer markers pending- CEA, Ca 19-9  -consult to palliative care, oncology, and chaplin per patient wishes  Lower extremity swelling- improving- secondary to liver mass compressing IVC. Echo with compression of IVC, normal EF, G1DD -monitor fluid status -elevate legs and add compression stockings -hold off on diuresis and extra fluids for now  Lactic acidosis- resolved- Lactic acid elevated on admission to 2.1 > 2.7>1.7. No obvious source of infection. No signs of pneumonia or UTI on  imaging/urinalysis. Could be due to malignancy.  -Resolved  Leukocytosis- persistent- WBC elevated to 17.3 on admission. Trend > 16.6 > 17.3 > 15.6 -continue to monitor  -monitor for signs of infection  Abnormal CTA Findings- Evidence of 4.1 cm ascending aortic aneurysm. Multiple pulmonary nodules. Multiple ill-defined hypodense liver lesions suspicious for neoplasm with giant hepatic lesion measuring 20 cm. Possible distal esophageal and GE junction thickening; further evaluation with endoscopy could be obtained. -consider endoscopy in future -recommend follow up annual CTA/MR for aortic aneurysm- monitor closely for signs of bleeding  HTN- stable- On Avalide (irbesartan-HCTZ 150-12.5 BID) at home. BP this morning 149/84 -continue home medications  -monitor BP -avoid dropping BP too low too quickly with patient's aortic aneurysm  T2DM- uncontrolled- Last A1C 14.3 in November 2017. CBG's have been elevated. This morning -increase Lantus to 20 units this morning -repeat A1C 12.8 -increase to moderate SSI -CBGs ACHS   FEN/GI: regular diet  Prophylaxis: eliquis  Disposition: pending clinical improvement and patient wishes  Subjective:  Samantha Norton is tired today, feels weak. Awaiting biopsy. Son is in room, frustrated biopsy has not been done yet. No scheduled time yet. No chest pain, SOB.   Objective: Temp:  [97.9 F (36.6 C)-99.1 F (37.3 C)] 99.1 F (37.3 C) (02/26 0500) Pulse Rate:  [73-87] 74 (02/26 0500) Resp:  [18] 18 (02/26 0500) BP: (143-157)/(81-86) 149/84 (02/26 0500) SpO2:  [97 %-98 %] 98 % (02/26 0500) Weight:  [129 lb 3.2 oz (58.6 kg)] 129 lb 3.2 oz (58.6 kg) (02/26 0500) Physical Exam: General: elderly lady laying in bed, appears comfortable, in NAD Cardiovascular: RRR no MRG Respiratory: CTAB no increased work of  breathing, on room air Gastrointestinal: hepatomegaly, Non-tender. Non-distended. No guarding or rebound MSK: +1 pitting edema in feet  bilaterally.  Psych: appropriate mood and affect  Laboratory:  Recent Labs Lab 06/05/16 0215 06/06/16 0314 06/07/16 0222  WBC 16.6* 17.3* 15.6*  HGB 11.7* 11.8* 10.6*  HCT 35.0* 34.4* 32.5*  PLT 56* 61* 81*    Recent Labs Lab 06/04/16 1418 06/05/16 0215 06/07/16 0222  NA 138 140 139  K 3.5 3.2* 3.4*  CL 97* 100* 104  CO2 _0 BUN 27* 27* 21*  CREATININE 0.94 0.97 0.86  CALCIUM 9.6 9.0 8.7*  PROT 6.0* 5.3* 4.8*  BILITOT 2.1* 1.7* 1.5*  ALKPHOS 275* 232* 253*  ALT 84* 70* 81*  AST 90* 73* 87*  GLUCOSE 440* 390* 268*   TSH 1.2 D-dimer >20 BNP 313 I-stat trop neg Lactic Acid 2.71 >>1.7 A1C 12.8 AFP 3.9 (normal)  Imaging/Diagnostic Tests:  Echo  Study Conclusions  - Left ventricle: The cavity size was normal. There was mild focal   basal hypertrophy of the septum. Systolic function was mildly   reduced. The estimated ejection fraction was 50%. There is   akinesis of the basal-midinferolateral myocardium. Doppler   parameters are consistent with abnormal left ventricular   relaxation (grade 1 diastolic dysfunction). - Mitral valve: There was severe regurgitation. - Left atrium: The atrium was moderately dilated. - Right ventricle: The cavity size was below normal. Wall thickness   was normal. - Atrial septum: There was an atrial septal aneurysm. - Inferior vena cava: The vessel was small, appearing collapsed,   consistent with low central venous pressure. The vessel appeared   externally compressed. - Impressions: LVEF 50% with inferolateral hypokinesis and severe   MR. There appears to be a very large cystic structure arising   from the liver that is compressing the IVC and the right   ventricle. Suggest dedicated abdominal imaging.  Impressions:  - LVEF 50% with inferolateral hypokinesis and severe MR. There   appears to be a very large cystic structure arising from the   liver that is compressing the IVC and the right ventricle.   Suggest  dedicated abdominal imaging.  Dg Chest 2 View  Result Date: 06/04/2016 IMPRESSION: No active cardiopulmonary disease. Electronically Signed   By: Logan Bores M.D.   On: 06/04/2016 14:06   Ct Angio Chest Pe W And/or Wo Contrast  Result Date: 06/04/2016 IMPRESSION: 1. Examination is positive for small sub segmental pulmonary emboli in the right lower lobe and left upper lobe. 2. Mild aneurysmal dilatation of the ascending aorta up to 4.1 cm. Recommend annual imaging followup by CTA or MRA. This recommendation follows 2010 ACCF/AHA/AATS/ACR/ASA/SCA/SCAI/SIR/STS/SVM Guidelines for the Diagnosis and Management of Patients with Thoracic Aortic Disease. Circulation. 2010; 121: W737-T062 3. Multiple pulmonary nodules. Non-contrast chest CT at 3-6 months is recommended. If the nodules are stable at time of repeat CT, then future CT at 18-24 months (from today's scan) is considered optional for low-risk patients, but is recommended for high-risk patients. This recommendation follows the consensus statement: Guidelines for Management of Incidental Pulmonary Nodules Detected on CT Images: From the Fleischner Society 2017; Radiology 2017; 284:228-243. 4. Multiple ill-defined hypodense liver lesions suspicious for neoplasm. Giant hepatic cyst measuring 20 cm. 5. Possible distal esophageal and GE junction thickening; further evaluation with endoscopy could be obtained. Critical Value/emergent results were called by telephone at the time of interpretation on 06/04/2016 at 7:21 pm to Dr. Jola Schmidt , who verbally acknowledged these  results. Electronically Signed   By: Donavan Foil M.D.   On: 06/04/2016 19:22   Mr Abdomen W Wo Contrast  Result Date: 06/05/2016 CLINICAL DATA:  Right-sided abdominal pain and nausea. Hepatic lesion seen on recent noncontrast CT. EXAM: MRI ABDOMEN WITHOUT AND WITH CONTRAST TECHNIQUE: Multiplanar multisequence MR imaging of the abdomen was performed both before and after the administration  of intravenous contrast. CONTRAST:  49m MULTIHANCE GADOBENATE DIMEGLUMINE 529 MG/ML IV SOLN COMPARISON:  CT on 05/25/2016 FINDINGS: Lower chest: No acute findings. Hepatobiliary: A large simple appearing cyst is seen throughout the left hepatic lobe measuring 20 cm in diameter. A few other small cysts are seen in the liver dome and inferior right hepatic lobe. Multiple hypovascular enhancing masses seen throughout the remainder of the liver, some of which show central necrosis. Largest of these is in the anterior dome of the liver measuring 4.7 x 7.5 cm. These are consistent with diffuse liver metastases. Pancreas: Mass in the pancreatic tail shows peripheral enhancement and central cystic area. This measures 2.6 x 5.2 cm on image 87/902, suspicious for primary pancreatic carcinoma. Spleen: No evidence of splenomegaly. Mild scarring or old infarcts noted in the lateral aspect of spleen. No splenic mass identified. Adrenals/Urinary Tract: Small bilateral renal cysts. No evidence of renal mass or hydronephrosis. Stomach/Bowel: Small hiatal hernia.  Otherwise unremarkable. Vascular/Lymphatic: Evaluation limited by mass effect from the enlarged liver, however no definite pathologically enlarged lymph nodes identified. No abdominal aortic aneurysm. Other:  Diffuse mesenteric and body wall edema. Musculoskeletal:  No suspicious bone lesions identified. IMPRESSION: Multiple hepatic metastases. Several large hepatic cysts are also noted, largest in the left lobe measuring 20 cm. 5.2 cm mass in the pancreatic tail, suspicious for primary pancreatic carcinoma. Electronically Signed   By: JEarle GellM.D.   On: 06/05/2016 09:27   ASteve Rattler DO 06/07/2016, 7:32 AM PGY-1, CClearwaterIntern pager: [phone] text pages welcome

## 2016-06-07 NOTE — Telephone Encounter (Signed)
Call placed back to patient's son, Lanny Hurst and notified him per order of Dr. Benay Spice that there is no rush or emergency for the biopsy and that Dr. Benay Spice would be by to see patient in the AM.  Patient's son appreciative of call back and has no further questions at this time.

## 2016-06-07 NOTE — Progress Notes (Signed)
   06/07/16 1000  Clinical Encounter Type  Visited With Patient and family together  Visit Type Other (Comment) (Malcolm consult)  Spiritual Encounters  Spiritual Needs Emotional  Stress Factors  Patient Stress Factors None identified  Family Stress Factors None identified  Introduction to Pt and son. Unaware of advanced directive consult. After discussion with MD they stated they may be interested. Follow up later.

## 2016-06-07 NOTE — Progress Notes (Signed)
[facility] for heparin Indication: pulmonary embolus  Patient Measurements: Height: 5\' 1"  (154.9 cm) Weight: 129 lb 3.2 oz (58.6 kg) IBW/kg (Calculated) : 47.8 Heparin Dosing Weight: = TBW 58.6kg   Assessment: 50 yof with SOB, LE swelling x 1 month, d-dimer >20 - found to have a PE and also noted with probable metastatic cancer to the lungs.  She was on apixiban and plans noted for biopsy and anticoagulation plans are to change back to heparin with plans for biopsy on 2/18 -last dose of apixiban was 10mg  at ~ 9pm 2/25 Prior to Apixaban , previously on IV heparin rate  900 units/hr (on 2/24) with heparin level= 0.49, thus heparin infusion resumed at this rate today ~ 13:00.   PTT = 118 seconds drawn ~ 7 hours after heparin drip restarted at 900 units/hr. PTT is above goal 66-102 seconds.  No bleeding noted per RN. RN reports that heparin drip is infusing into vein inpt's right arm and it appears lab drew PTT from left arm (bandaged), thus aPTT of 118 sec should be accurate.    Goal of Therapy:  APTT= 66-102 Heparin level 0.3-0.7 units/ml Monitor platelets by anticoagulation protocol: Yes   Plan:  -Decrease IV heparin to 800 units/hr -aPTT in ~6-7  hours with 5am labs, and daily wth CBC daily  Nicole Cella, RPh Clinical Pharmacist Pager: [phone]  06/07/2016 10:15 PM

## 2016-06-07 NOTE — Progress Notes (Signed)
[facility] for heparin Indication: pulmonary embolus  Heparin Dosing Weight: 54.4 kg   Assessment: 25 yof with SOB, LE swelling x 1 month, d-dimer >20 - found to have a PE and also noted with probable metastatic cancer to the lungs.  She was on apixiban and plans noted for biopsy and anticoagulation plans are to change back to heparin with plans for biopsy on 2/18 -lat dose of apixiban was 10mg  at ~ 9pm 2/25 -last heparin rate was 900 units/hr (on 2/24) with heparin level= 0.49   Goal of Therapy:  APTT= 66-102 Heparin level 0.3-0.7 units/ml Monitor platelets by anticoagulation protocol: Yes   Plan:  -No heparin bolus with recent apixiban -restart heparin at 900 units/hr -aPTT in 6 hours and daily wth CBC daily  Hildred Laser, Pharm D 06/07/2016 12:11 PM

## 2016-06-07 NOTE — Progress Notes (Signed)
Inpatient Diabetes Program Recommendations  AACE/ADA: New Consensus Statement on Inpatient Glycemic Control (2015)  Target Ranges:  Prepandial:   less than 140 mg/dL      Peak postprandial:   less than 180 mg/dL (1-2 hours)      Critically ill patients:  140 - 180 mg/dL   Lab Results  Component Value Date   GLUCAP 209 (H) 06/07/2016   HGBA1C 12.8 (H) 06/05/2016    Review of Glycemic Control:  Results for Samantha Norton, Samantha Norton (MRN [id number]) as of 06/07/2016 13:05  Ref. Range 06/06/2016 09:01 06/06/2016 11:03 06/06/2016 17:08 06/06/2016 21:08 06/07/2016 06:24  Glucose-Capillary Latest Ref Range: 65 - 99 mg/dL 188 (H) 248 (H) 372 (H) 457 (H) 209 (H)    Diabetes history: Diabetes Outpatient Diabetes medications: Lantus 8 units daily Current orders for Inpatient glycemic control:  Novolog moderate tid with meals and HS, Lantus 20 units daily  Inpatient Diabetes Program Recommendations:    Note that Lantus increased to 20 units.  If lunch and supper blood sugars remain elevated, may consider adding Novolog 3 units tid with meals to cover CHO intake.   Thanks, Adah Perl, RN, BC-ADM Inpatient Diabetes Coordinator Pager [phone] (8a-5p)

## 2016-06-07 NOTE — Consult Note (Signed)
Chief Complaint: Patient was seen in consultation today for liver lesion bx and liver cyst aspiration Chief Complaint  Patient presents with  . Hypertension  . Emesis   at the request of Dr Christianne Borrow  Referring Physician(s):  Dr Christianne Borrow Supervising Physician: Arne Cleveland  Patient Status: [facility] - In-pt  History of Present Illness: Samantha Norton is a 83 y.o. female   Admitted 2/23 with Dyspnea; N/V + PE per CTA Eliquis until last pm Now Heparin drip for PE Last dose Eliquis 2/25 pm  CTA 2/23: IMPRESSION: 1. Examination is positive for small sub segmental pulmonary emboli in the right lower lobe and left upper lobe. 2. Mild aneurysmal dilatation of the ascending aorta up to 4.1 cm. Recommend annual imaging followup by CTA or MRA. This recommendation follows 2010 ACCF/AHA/AATS/ACR/ASA/SCA/SCAI/SIR/STS/SVM Guidelines for the Diagnosis and Management of Patients with Thoracic Aortic Disease. Circulation. 2010; 121: S923-R007 3. Multiple pulmonary nodules. Non-contrast chest CT at 3-6 months is recommended. If the nodules are stable at time of repeat CT, then future CT at 18-24 months (from today's scan) is considered optional for low-risk patients, but is recommended for high-risk patients. This recommendation follows the consensus statement: Guidelines for Management of Incidental Pulmonary Nodules Detected on CT Images: From the Fleischner Society 2017; Radiology 2017; 284:228-243. 4. Multiple ill-defined hypodense liver lesions suspicious for neoplasm. Giant hepatic cyst measuring 20 cm.  MR 2/24: IMPRESSION: Multiple hepatic metastases. Several large hepatic cysts are also noted, largest in the left lobe measuring 20 cm. 5.2 cm mass in the pancreatic tail, suspicious for primary pancreatic carcinoma.  Request for liver lesion biopsy and liver cyst aspiration Approved by Dr Vernard Gambles Off Eliquis now Plan for Wed 2/28 in Radiology We will dc Hep prior to  Bx---look for PA order.   Past Medical History:  Diagnosis Date  . Anemia    thrombocytopenia, seen by hematology.  . Diabetes mellitus    no meds  . Diverticulosis of colon   . GERD (gastroesophageal reflux disease)    no medications. Last EGD 2+ years ago. Had H. Pylori  . Hyperlipidemia   . Hypertension   . Osteoporosis    by patient report - osteopenia  . Varicella    in high school  . Wears dentures    top  . Wears glasses     Past Surgical History:  Procedure Laterality Date  . ABDOMINAL HYSTERECTOMY     partial, due to fibroid tumors.  Marland Kitchen BREAST LUMPECTOMY     bilateral biospsies over time x 3-4 , always beng  . FASCIOTOMY Right 03/14/2013   Procedure: FASCIOTOMY RIGHT RING FINGER;  Surgeon: Wynonia Sours, MD;  Location: [place];  Service: Orthopedics;  Laterality: Right;  . tonsilectomy      Allergies: Patient has no known allergies.  Medications: Prior to Admission medications   Medication Sig Start Date End Date Taking? Authorizing Provider  B Complex Vitamins (B COMPLEX 100 PO) Take 1 tablet by mouth daily.   Yes Historical Provider, MD  blood glucose meter kit and supplies KIT Dispense based on patient and insurance preference. Use up to four times daily as directed. (FOR ICD-9 250.00, 250.01). 03/01/16  Yes Everson N Rumley, DO  calcium-vitamin D (OSCAL WITH D) 250-125 MG-UNIT tablet Take 1 tablet by mouth daily. 01/12/16  Yes Verner Mould, MD  Insulin Glargine (LANTUS) 100 UNIT/ML Solostar Pen Inject 8 Units into the skin daily with breakfast. 03/11/16  Yes Gwyndolyn Saxon  A Hensel, MD  irbesartan-hydrochlorothiazide (AVALIDE) 150-12.5 MG tablet TAKE 2 TABLETS DAILY Patient taking differently: Take 1 tablet by mouth 2 (two) times daily.  01/12/16  Yes Verner Mould, MD  Olopatadine HCl (PATADAY) 0.2 % SOLN Place 1 drop into both eyes daily as needed. Patient taking differently: Place 1 drop into both eyes daily as needed (for  itchiness).  01/12/16  Yes Verner Mould, MD  ONE TOUCH ULTRA TEST test strip 1 each by Other route 3 (three) times daily. 03/29/16  Yes Wood[place], MD  [facility] DELICA LANCETS 70J MISC Inject 1 Device into the skin daily. 03/02/16  Yes Historical Provider, MD  terconazole (TERAZOL 7) 0.4 % vaginal cream Place 1 applicator vaginally at bedtime.  05/25/16  Yes Historical Provider, MD  TURMERIC PO Take 1 capsule by mouth daily.    Yes Historical Provider, MD  insulin glargine (LANTUS) 100 UNIT/ML injection Inject 0.08 mLs (8 Units total) into the skin daily with breakfast. Patient not taking: Reported on 06/04/2016 05/31/16 06/30/16  Reece Agar, PA-C     Family History  Problem Relation Age of Onset  . Hypertension Mother   . Stroke Mother   . Diabetes Mother   . Stroke Father   . Hypertension Father   . Arthritis Brother     hip problems, in w/c  . Cancer Daughter     breast    Social History   Social History  . Marital status: Divorced    Spouse name: N/A  . Number of children: 7  . Years of education: 60   Occupational History  . Postal clerk Retired    retired at [employer] years   Social History Main Topics  . Smoking status: Former Smoker    Quit date: 02/08/1972  . Smokeless tobacco: Never Used  . Alcohol use Yes     Comment: rare glass of wine  . Drug use: No  . Sexual activity: Not Currently   Other Topics Concern  . None   Social History Narrative   HSG, 1 year [employer] Married '52 - 22 yrs/divorced. 4 drtrs  '52, '54, '58, '60; 3 sons - '59, '62, '66; grandchildren 16; a lot of great-grands 50. Native of [place]. 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    LIves alone. Closest relative - a son in [place]. No futile or heroic measures but not decided about the details of CPR. Would not want long-term mechanical ventilation.     Review of Systems: A 12 point ROS discussed and pertinent positives are indicated in the HPI above.  All other  systems are negative.  Review of Systems  Constitutional: Positive for activity change and appetite change. Negative for fatigue, fever and unexpected weight change.  Respiratory: Negative for shortness of breath.   Cardiovascular: Negative for chest pain.  Gastrointestinal: Positive for abdominal distention and nausea. Negative for abdominal pain.  Musculoskeletal: Negative for back pain.  Psychiatric/Behavioral: Negative for behavioral problems and confusion.    Vital Signs: BP (!) 149/84 (BP Location: Right Leg)   Pulse 74   Temp 99.1 F (37.3 C) (Oral)   Resp 18   Ht 5' 1"  (1.549 m)   Wt 129 lb 3.2 oz (58.6 kg)   SpO2 98%   BMI 24.41 kg/m   Physical Exam  Constitutional: She is oriented to person, place, and time.  Cardiovascular: Normal rate and regular rhythm.   Pulmonary/Chest: Effort normal and breath sounds normal.  Abdominal: Soft. Bowel sounds  are normal. She exhibits distension. There is no tenderness.  Musculoskeletal: Normal range of motion.  Neurological: She is alert and oriented to person, place, and time.  Skin: Skin is warm and dry.  Psychiatric: She has a normal mood and affect. Her behavior is normal. Judgment and thought content normal.  Nursing note and vitals reviewed.   Mallampati Score:  MD Evaluation Airway: WNL Heart: WNL Abdomen: WNL Chest/ Lungs: WNL ASA  Classification: 3 Mallampati/Airway Score: One  Imaging: Ct Abdomen Pelvis Wo Contrast  Result Date: 05/25/2016 CLINICAL DATA:  Nausea and pain and right-sided roots EXAM: CT ABDOMEN AND PELVIS WITHOUT CONTRAST TECHNIQUE: Multidetector CT imaging of the abdomen and pelvis was performed following the standard protocol without IV contrast. COMPARISON:  None. FINDINGS: Lower chest: No pulmonary nodules. No visible pleural or pericardial effusion. There is a calcified subcarinal lymph node. Hepatobiliary: There is a large fluid density structure within the liver measuring 19 x 14 cm. There is  a smaller cyst in the lower right hepatic lobe measuring 1.3 cm. No biliary dilatation. There are multiple areas of low density in the hepatic dome. There is cholelithiasis without evidence of acute cholecystitis. Pancreas: Visualization of the pancreas is limited. The pancreatic tail appears normal. Spleen: Normal. Adrenals/Urinary Tract: Normal adrenal glands. There bilateral nonobstructing renal stones measuring up to 3 mm. No hydronephrosis. Hyperdense focus in the anterior right kidney, likely proteinaceous cyst. Stomach/Bowel: No abnormal bowel dilatation. No bowel wall thickening or adjacent fat stranding to indicate acute inflammation. No abdominal fluid collection. Normal appendix. Vascular/Lymphatic: There is atherosclerotic calcification of the non aneurysmal abdominal aorta. No abdominal or pelvic adenopathy. Reproductive: Status post hysterectomy.  No adnexal mass. Musculoskeletal: Severe lower lumbar facet arthrosis. No spinal canal stenosis. No lytic or blastic lesions. Normal visualized extrathoracic and extraperitoneal soft tissues. Other: No contributory non-categorized findings. IMPRESSION: 1. Multiple to hypoattenuating lesions within the liver, predominantly in the hepatic dome. The the density measures greater than would be expected for simple cystic lesions. Otherwise, these lesions are incompletely evaluated in the absence of IV contrast and are concerning for neoplastic process. MRI of the abdomen with and without contrast is recommended, if there are no contraindications. Otherwise, a multiphase hepatic CT would be an alternative. 2. Massive hepatic cyst measuring up to 19 cm. 3. Bilateral nonobstructing nephrolithiasis. 4. Cholelithiasis. Electronically Signed   By: Ulyses Jarred M.D.   On: 05/25/2016 17:57   Dg Chest 2 View  Result Date: 06/04/2016 CLINICAL DATA:  Chest pain. EXAM: CHEST  2 VIEW COMPARISON:  03/11/2011 FINDINGS: The cardiomediastinal silhouette is unchanged. Cardiac  silhouette is upper limits of normal in size. Anterior eventration of the right hemidiaphragm is stable to slightly increased. No airspace consolidation, edema, pleural effusion, or pneumothorax is identified. A shaped thoracolumbar scoliosis is partially visualized. IMPRESSION: No active cardiopulmonary disease. Electronically Signed   By: Logan Bores M.D.   On: 06/04/2016 14:06   Ct Angio Chest Pe W And/or Wo Contrast  Result Date: 06/04/2016 CLINICAL DATA:  Shortness of breath chest pain with nausea and vomiting EXAM: CT ANGIOGRAPHY CHEST WITH CONTRAST TECHNIQUE: Multidetector CT imaging of the chest was performed using the standard protocol during bolus administration of intravenous contrast. Multiplanar CT image reconstructions and MIPs were obtained to evaluate the vascular anatomy. CONTRAST:  80 mL Isovue 370 intravenous COMPARISON:  Chest x-ray 06/04/2016 FINDINGS: Cardiovascular: Satisfactory opacification of the pulmonary arteries to the segmental level. Small filling defects within right lower lobe subsegmental pulmonary artery, series 7,  image number 150 through 153, and within left upper lobe subsegmental pulmonary artery, series 7, image number 103. No other filling defects are visualized. There is mild aneurysmal dilatation of the ascending aorta up to 4.1 cm. Atherosclerosis is noted. There is cardiomegaly. No large pericardial effusion is present. Mediastinum/Nodes: Calcified sub- carinal lymph node. Trachea midline. Thyroid grossly unremarkable. The esophagus is nondilated. Possible wall thickening of the distal esophagus/ GE junction. Lungs/Pleura: No acute consolidation. No pleural effusion or pneumothorax. Multiple, greater than 5 small pulmonary nodules within the right upper, lower, and middle lobes. Somewhat ill-defined nodule in the right middle lobe measures 7 mm. Small ground-glass nodule subpleural left lower lobe, series 6, image number 59. Upper Abdomen: Multiple ill-defined  hypodense liver lesions. Large hepatic cyst measuring approximately 20 cm. Musculoskeletal: No acute or suspicious bone lesion. Review of the MIP images confirms the above findings. IMPRESSION: 1. Examination is positive for small sub segmental pulmonary emboli in the right lower lobe and left upper lobe. 2. Mild aneurysmal dilatation of the ascending aorta up to 4.1 cm. Recommend annual imaging followup by CTA or MRA. This recommendation follows 2010 ACCF/AHA/AATS/ACR/ASA/SCA/SCAI/SIR/STS/SVM Guidelines for the Diagnosis and Management of Patients with Thoracic Aortic Disease. Circulation. 2010; 121: J825-K539 3. Multiple pulmonary nodules. Non-contrast chest CT at 3-6 months is recommended. If the nodules are stable at time of repeat CT, then future CT at 18-24 months (from today's scan) is considered optional for low-risk patients, but is recommended for high-risk patients. This recommendation follows the consensus statement: Guidelines for Management of Incidental Pulmonary Nodules Detected on CT Images: From the Fleischner Society 2017; Radiology 2017; 284:228-243. 4. Multiple ill-defined hypodense liver lesions suspicious for neoplasm. Giant hepatic cyst measuring 20 cm. 5. Possible distal esophageal and GE junction thickening; further evaluation with endoscopy could be obtained. Critical Value/emergent results were called by telephone at the time of interpretation on 06/04/2016 at 7:21 pm to Dr. Jola Schmidt , who verbally acknowledged these results. Electronically Signed   By: Donavan Foil M.D.   On: 06/04/2016 19:22   Mr Abdomen W Wo Contrast  Result Date: 06/05/2016 CLINICAL DATA:  Right-sided abdominal pain and nausea. Hepatic lesion seen on recent noncontrast CT. EXAM: MRI ABDOMEN WITHOUT AND WITH CONTRAST TECHNIQUE: Multiplanar multisequence MR imaging of the abdomen was performed both before and after the administration of intravenous contrast. CONTRAST:  34m MULTIHANCE GADOBENATE DIMEGLUMINE 529  MG/ML IV SOLN COMPARISON:  CT on 05/25/2016 FINDINGS: Lower chest: No acute findings. Hepatobiliary: A large simple appearing cyst is seen throughout the left hepatic lobe measuring 20 cm in diameter. A few other small cysts are seen in the liver dome and inferior right hepatic lobe. Multiple hypovascular enhancing masses seen throughout the remainder of the liver, some of which show central necrosis. Largest of these is in the anterior dome of the liver measuring 4.7 x 7.5 cm. These are consistent with diffuse liver metastases. Pancreas: Mass in the pancreatic tail shows peripheral enhancement and central cystic area. This measures 2.6 x 5.2 cm on image 87/902, suspicious for primary pancreatic carcinoma. Spleen: No evidence of splenomegaly. Mild scarring or old infarcts noted in the lateral aspect of spleen. No splenic mass identified. Adrenals/Urinary Tract: Small bilateral renal cysts. No evidence of renal mass or hydronephrosis. Stomach/Bowel: Small hiatal hernia.  Otherwise unremarkable. Vascular/Lymphatic: Evaluation limited by mass effect from the enlarged liver, however no definite pathologically enlarged lymph nodes identified. No abdominal aortic aneurysm. Other:  Diffuse mesenteric and body wall edema. Musculoskeletal:  No  suspicious bone lesions identified. IMPRESSION: Multiple hepatic metastases. Several large hepatic cysts are also noted, largest in the left lobe measuring 20 cm. 5.2 cm mass in the pancreatic tail, suspicious for primary pancreatic carcinoma. Electronically Signed   By: Earle Gell M.D.   On: 06/05/2016 09:27    Labs:  CBC:  Recent Labs  06/04/16 1310 06/05/16 0215 06/06/16 0314 06/07/16 0222  WBC 17.3* 16.6* 17.3* 15.6*  HGB 13.2 11.7* 11.8* 10.6*  HCT 40.1 35.0* 34.4* 32.5*  PLT 80* 56* 61* 81*    COAGS:  Recent Labs  06/04/16 1418  INR 1.59    BMP:  Recent Labs  05/25/16 1611 06/04/16 1418 06/05/16 0215 06/07/16 0222  NA 136 138 140 139  K 3.8  3.5 3.2* 3.4*  CL 94* 97* 100* 104  CO2 26 28 31 29   GLUCOSE 370* 440* 390* 268*  BUN 25 27* 27* 21*  CALCIUM 9.8 9.6 9.0 8.7*  CREATININE 0.95* 0.94 0.97 0.86  GFRNONAA  --  55* 53* >60  GFRAA  --  >60 >60 >60    LIVER FUNCTION TESTS:  Recent Labs  03/01/16 1601 06/04/16 1418 06/05/16 0215 06/07/16 0222  BILITOT 1.0 2.1* 1.7* 1.5*  AST 16 90* 73* 87*  ALT 17 84* 70* 81*  ALKPHOS 58 275* 232* 253*  PROT 7.3 6.0* 5.3* 4.8*  ALBUMIN 4.2 2.6* 2.2* 2.0*    TUMOR MARKERS:  Recent Labs  06/04/16 2142  AFPTM 3.9    Assessment and Plan:  New dx of liver lesion and cyst    And + PE  Was placed on Eliquis--- now off since 2/25 pm Scheduled for liver lesion biopsy with liver cyst aspiration in Radiology 2/28 Eliquis now held; Heparin drip ongoing We will hold Heparin in am of procedure---watch for IR PA order Risks and Benefits discussed with the patient including, but not limited to bleeding, infection, damage to adjacent structures or low yield requiring additional tests. All of the patient's questions were answered, patient is agreeable to proceed. Consent signed and in chart.  Thank you for this interesting consult.  I greatly enjoyed meeting Samantha Norton and look forward to participating in their care.  A copy of this report was sent to the requesting provider on this date.  Electronically Signed: Monia Sabal A 06/07/2016, 2:06 PM   I spent a total of 40 Minutes    in face to face in clinical consultation, greater than 50% of which was counseling/coordinating care for liver lesion bx and liver cyst aspiration 2/28

## 2016-06-07 NOTE — Consult Note (Signed)
New Hematology/Oncology Consult   Referral MD: Talbert Cage     Reason for Referral: Pancreas mass, liver lesions     HPI: Ms. Kado reports malaise beginning in November 2017. There was associated nausea and anorexia. She has discomfort in the right subcostal region. She was diagnosed with hyperglycemia during the same time frame. She was undergoing an MRI of the liver 06/04/2016 and developed dyspnea. She was admitted for further evaluation. A CT of the chest 06/04/2016 revealed multiple ill-defined hypodense liver lesions and a large hepatic cyst. Small subsegmental pulmonary emboli were noted in the right and left lung. She was admitted and placed on anticoagulation therapy. An MRI of the abdomen on 06/05/2016 revealed a tail of the pancreas mass, multiple hypovascular enhancing liver lesions consistent with metastases, and no lymphadenopathy.  She is being scheduled for a biopsy of a liver lesion.     Past Medical History:  Diagnosis Date  . Anemia    thrombocytopenia, Followed by Dr. Alen Blew   . Diabetes mellitus    no meds  . Diverticulosis of colon   . GERD (gastroesophageal reflux disease)    no medications. Last EGD 2+ years ago. Had H. Pylori  . Hyperlipidemia   . Hypertension   . Osteoporosis    by patient report - osteopenia  . Varicella    in high school  . Wears dentures    top  . Wears glasses   : .  G7 P7   Past Surgical History:  Procedure Laterality Date  . ABDOMINAL HYSTERECTOMY     partial, due to fibroid tumors.  Marland Kitchen BREAST LUMPECTOMY     bilateral biospsies over time x 3-4 , always beng  . FASCIOTOMY Right 03/14/2013   Procedure: FASCIOTOMY RIGHT RING FINGER;  Surgeon: Wynonia Sours, MD;  Location: [facility];  Service: Orthopedics;  Laterality: Right;  . tonsilectomy    :   Current Facility-Administered Medications:  .  0.9 %  sodium chloride infusion, 250 mL, Intravenous, PRN, Steve Rattler, DO .  acetaminophen  (TYLENOL) tablet 650 mg, 650 mg, Oral, Q6H PRN **OR** acetaminophen (TYLENOL) suppository 650 mg, 650 mg, Rectal, Q6H PRN, Gardiner Rhyme Riccio, DO .  alum & mag hydroxide-simeth (MAALOX/MYLANTA) 200-200-20 MG/5ML suspension 15 mL, 15 mL, Oral, PRN, Lind Covert, MD .  feeding supplement (ENSURE ENLIVE) (ENSURE ENLIVE) liquid 237 mL, 237 mL, Oral, BID BM, Lind Covert, MD, 237 mL at 06/06/16 1500 .  heparin ADULT infusion 100 units/mL (25000 units/242mL sodium chloride 0.45%), 900 Units/hr, Intravenous, Continuous, Kris Mouton, [facility], Last Rate: 9 mL/hr at 06/07/16 1253, 900 Units/hr at 06/07/16 1253 .  irbesartan (AVAPRO) tablet 150 mg, 150 mg, Oral, BID, 150 mg at 06/07/16 1003 **AND** hydrochlorothiazide (MICROZIDE) capsule 12.5 mg, 12.5 mg, Oral, BID, Lind Covert, MD, 12.5 mg at 06/07/16 1003 .  insulin aspart (novoLOG) injection 0-15 Units, 0-15 Units, Subcutaneous, TID WC, Steve Rattler, DO, 8 Units at 06/07/16 1222 .  insulin aspart (novoLOG) injection 0-5 Units, 0-5 Units, Subcutaneous, QHS, Steve Rattler, DO, 5 Units at 06/05/16 2140 .  insulin glargine (LANTUS) injection 20 Units, 20 Units, Subcutaneous, Q breakfast, Steve Rattler, DO, 20 Units at 06/07/16 1224 .  ondansetron (ZOFRAN) tablet 4 mg, 4 mg, Oral, Q6H PRN **OR** ondansetron (ZOFRAN) injection 4 mg, 4 mg, Intravenous, Q6H PRN, Angela C Riccio, DO .  polyethylene glycol (MIRALAX / GLYCOLAX) packet 17 g, 17 g, Oral, Daily PRN, Steve Rattler,  DO .  sodium chloride flush (NS) 0.9 % injection 3 mL, 3 mL, Intravenous, Q12H, Angela C Riccio, DO, 3 mL at 06/06/16 2120 .  sodium chloride flush (NS) 0.9 % injection 3 mL, 3 mL, Intravenous, Q12H, Angela C Riccio, DO, 3 mL at 06/07/16 1003 .  sodium chloride flush (NS) 0.9 % injection 3 mL, 3 mL, Intravenous, PRN, Steve Rattler, DO:  . feeding supplement (ENSURE ENLIVE)  237 mL Oral BID BM  . irbesartan  150 mg Oral BID   And  . hydrochlorothiazide  12.5 mg  Oral BID  . insulin aspart  0-15 Units Subcutaneous TID WC  . insulin aspart  0-5 Units Subcutaneous QHS  . insulin glargine  20 Units Subcutaneous Q breakfast  . sodium chloride flush  3 mL Intravenous Q12H  . sodium chloride flush  3 mL Intravenous Q12H  :  No Known Allergies:  Family History  Problem Relation Age of Onset  . Hypertension Mother   . Stroke Mother   . Diabetes Mother   . Stroke Father   . Hypertension Father   . Arthritis Brother     hip problems, in w/c  . Cancer Daughter2      breast  : .  Cancer-:, Cousin   Social History   Social History  . Marital status: Divorced    Spouse name: N/A  . Number of children: 7  . Years of education: 21   Occupational History  . Postal clerk Retired    retired at [employer] years   Social History Main Topics  . Smoking status: Former Smoker    Quit date: 02/08/1972  . Smokeless tobacco: Never Used  . Alcohol use Yes     Comment: rare glass of wine  . Drug use: No  . Sexual activity: Not Currently   Other Topics Concern  . Not on file   Social History Narrative   HSG, 1 year [employer] Married '52 - 22 yrs/divorced. 4 drtrs  '52, '54, '58, '60; 3 sons - '59, '62, '66; grandchildren 16; a lot of great-grands 79. Native of [place]. 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    LIves alone. Closest relative - a son in [place]. No futile or heroic measures but not decided about the details of CPR. Would not want long-term mechanical ventilation.   :  Review of Systems:  Positives include:Malaise, anorexia, weight loss, anus the right upper abdomen/costal margin  A complete ROS was otherwise negative.   Physical Exam:  Blood pressure (!) 149/84, pulse 74, temperature 99.1 F (37.3 C), temperature source Oral, resp. rate 18, height 5\' 1"  (1.549 m), weight 129 lb 3.2 oz (58.6 kg), SpO2 98 %.  HEENT: Neck without mass  Lungs: Clear bilaterally, no respiratory distress  Cardiac: Regular rate and rhythm  Abdomen:  Fullness in the right upper abdomen, no discrete mass, no splenomegaly, no apparent ascites   Vascular: Trace edema at the lower leg bilaterally  Lymph nodes: No cervical, supraclavicular, or axillary nodes  Neurologic: Alert and oriented, the motor exam appears intact in the upper and lower extremities  Skin: No rash  Musculoskeletal: No spine tenderness  LABS:   Recent Labs  06/06/16 0314 06/07/16 0222  WBC 17.3* 15.6*  HGB 11.8* 10.6*  HCT 34.4* 32.5*  PLT 61* 81*     Recent Labs  06/05/16 0215 06/07/16 0222  NA 140 139  K 3.2* 3.4*  CL 100* 104  CO2 31 29  GLUCOSE 390*  268*  BUN 27* 21*  CREATININE 0.97 0.86  CALCIUM 9.0 8.7*      RADIOLOGY:  Ct Abdomen Pelvis Wo Contrast  Result Date: 05/25/2016 CLINICAL DATA:  Nausea and pain and right-sided roots EXAM: CT ABDOMEN AND PELVIS WITHOUT CONTRAST TECHNIQUE: Multidetector CT imaging of the abdomen and pelvis was performed following the standard protocol without IV contrast. COMPARISON:  None. FINDINGS: Lower chest: No pulmonary nodules. No visible pleural or pericardial effusion. There is a calcified subcarinal lymph node. Hepatobiliary: There is a large fluid density structure within the liver measuring 19 x 14 cm. There is a smaller cyst in the lower right hepatic lobe measuring 1.3 cm. No biliary dilatation. There are multiple areas of low density in the hepatic dome. There is cholelithiasis without evidence of acute cholecystitis. Pancreas: Visualization of the pancreas is limited. The pancreatic tail appears normal. Spleen: Normal. Adrenals/Urinary Tract: Normal adrenal glands. There bilateral nonobstructing renal stones measuring up to 3 mm. No hydronephrosis. Hyperdense focus in the anterior right kidney, likely proteinaceous cyst. Stomach/Bowel: No abnormal bowel dilatation. No bowel wall thickening or adjacent fat stranding to indicate acute inflammation. No abdominal fluid collection. Normal appendix.  Vascular/Lymphatic: There is atherosclerotic calcification of the non aneurysmal abdominal aorta. No abdominal or pelvic adenopathy. Reproductive: Status post hysterectomy.  No adnexal mass. Musculoskeletal: Severe lower lumbar facet arthrosis. No spinal canal stenosis. No lytic or blastic lesions. Normal visualized extrathoracic and extraperitoneal soft tissues. Other: No contributory non-categorized findings. IMPRESSION: 1. Multiple to hypoattenuating lesions within the liver, predominantly in the hepatic dome. The the density measures greater than would be expected for simple cystic lesions. Otherwise, these lesions are incompletely evaluated in the absence of IV contrast and are concerning for neoplastic process. MRI of the abdomen with and without contrast is recommended, if there are no contraindications. Otherwise, a multiphase hepatic CT would be an alternative. 2. Massive hepatic cyst measuring up to 19 cm. 3. Bilateral nonobstructing nephrolithiasis. 4. Cholelithiasis. Electronically Signed   By: Ulyses Jarred M.D.   On: 05/25/2016 17:57   Dg Chest 2 View  Result Date: 06/04/2016 CLINICAL DATA:  Chest pain. EXAM: CHEST  2 VIEW COMPARISON:  03/11/2011 FINDINGS: The cardiomediastinal silhouette is unchanged. Cardiac silhouette is upper limits of normal in size. Anterior eventration of the right hemidiaphragm is stable to slightly increased. No airspace consolidation, edema, pleural effusion, or pneumothorax is identified. A shaped thoracolumbar scoliosis is partially visualized. IMPRESSION: No active cardiopulmonary disease. Electronically Signed   By: Logan Bores M.D.   On: 06/04/2016 14:06   Ct Angio Chest Pe W And/or Wo Contrast  Result Date: 06/04/2016 CLINICAL DATA:  Shortness of breath chest pain with nausea and vomiting EXAM: CT ANGIOGRAPHY CHEST WITH CONTRAST TECHNIQUE: Multidetector CT imaging of the chest was performed using the standard protocol during bolus administration of intravenous  contrast. Multiplanar CT image reconstructions and MIPs were obtained to evaluate the vascular anatomy. CONTRAST:  80 mL Isovue 370 intravenous COMPARISON:  Chest x-ray 06/04/2016 FINDINGS: Cardiovascular: Satisfactory opacification of the pulmonary arteries to the segmental level. Small filling defects within right lower lobe subsegmental pulmonary artery, series 7, image number 150 through 153, and within left upper lobe subsegmental pulmonary artery, series 7, image number 103. No other filling defects are visualized. There is mild aneurysmal dilatation of the ascending aorta up to 4.1 cm. Atherosclerosis is noted. There is cardiomegaly. No large pericardial effusion is present. Mediastinum/Nodes: Calcified sub- carinal lymph node. Trachea midline. Thyroid grossly unremarkable. The esophagus is  nondilated. Possible wall thickening of the distal esophagus/ GE junction. Lungs/Pleura: No acute consolidation. No pleural effusion or pneumothorax. Multiple, greater than 5 small pulmonary nodules within the right upper, lower, and middle lobes. Somewhat ill-defined nodule in the right middle lobe measures 7 mm. Small ground-glass nodule subpleural left lower lobe, series 6, image number 59. Upper Abdomen: Multiple ill-defined hypodense liver lesions. Large hepatic cyst measuring approximately 20 cm. Musculoskeletal: No acute or suspicious bone lesion. Review of the MIP images confirms the above findings. IMPRESSION: 1. Examination is positive for small sub segmental pulmonary emboli in the right lower lobe and left upper lobe. 2. Mild aneurysmal dilatation of the ascending aorta up to 4.1 cm. Recommend annual imaging followup by CTA or MRA. This recommendation follows 2010 ACCF/AHA/AATS/ACR/ASA/SCA/SCAI/SIR/STS/SVM Guidelines for the Diagnosis and Management of Patients with Thoracic Aortic Disease. Circulation. 2010; 121: [id number] 3. Multiple pulmonary nodules. Non-contrast chest CT at 3-6 months is recommended. If  the nodules are stable at time of repeat CT, then future CT at 18-24 months (from today's scan) is considered optional for low-risk patients, but is recommended for high-risk patients. This recommendation follows the consensus statement: Guidelines for Management of Incidental Pulmonary Nodules Detected on CT Images: From the Fleischner Society 2017; Radiology 2017; 284:228-243. 4. Multiple ill-defined hypodense liver lesions suspicious for neoplasm. Giant hepatic cyst measuring 20 cm. 5. Possible distal esophageal and GE junction thickening; further evaluation with endoscopy could be obtained. Critical Value/emergent results were called by telephone at the time of interpretation on 06/04/2016 at 7:21 pm to Dr. Jola Schmidt , who verbally acknowledged these results. Electronically Signed   By: Donavan Foil M.D.   On: 06/04/2016 19:22   Mr Abdomen W Wo Contrast  Result Date: 06/05/2016 CLINICAL DATA:  Right-sided abdominal pain and nausea. Hepatic lesion seen on recent noncontrast CT. EXAM: MRI ABDOMEN WITHOUT AND WITH CONTRAST TECHNIQUE: Multiplanar multisequence MR imaging of the abdomen was performed both before and after the administration of intravenous contrast. CONTRAST:  56mL MULTIHANCE GADOBENATE DIMEGLUMINE 529 MG/ML IV SOLN COMPARISON:  CT on 05/25/2016 FINDINGS: Lower chest: No acute findings. Hepatobiliary: A large simple appearing cyst is seen throughout the left hepatic lobe measuring 20 cm in diameter. A few other small cysts are seen in the liver dome and inferior right hepatic lobe. Multiple hypovascular enhancing masses seen throughout the remainder of the liver, some of which show central necrosis. Largest of these is in the anterior dome of the liver measuring 4.7 x 7.5 cm. These are consistent with diffuse liver metastases. Pancreas: Mass in the pancreatic tail shows peripheral enhancement and central cystic area. This measures 2.6 x 5.2 cm on image 87/902, suspicious for primary pancreatic  carcinoma. Spleen: No evidence of splenomegaly. Mild scarring or old infarcts noted in the lateral aspect of spleen. No splenic mass identified. Adrenals/Urinary Tract: Small bilateral renal cysts. No evidence of renal mass or hydronephrosis. Stomach/Bowel: Small hiatal hernia.  Otherwise unremarkable. Vascular/Lymphatic: Evaluation limited by mass effect from the enlarged liver, however no definite pathologically enlarged lymph nodes identified. No abdominal aortic aneurysm. Other:  Diffuse mesenteric and body wall edema. Musculoskeletal:  No suspicious bone lesions identified. IMPRESSION: Multiple hepatic metastases. Several large hepatic cysts are also noted, largest in the left lobe measuring 20 cm. 5.2 cm mass in the pancreatic tail, suspicious for primary pancreatic carcinoma. Electronically Signed   By: Earle Gell M.D.   On: 06/05/2016 09:27    Assessment and Plan:   1. Pancreas mass, hypodense enhancing liver  lesions 2. Bilateral pulmonary emboli-maintained on heparin anticoagulation  Bilateral lower extremity deep vein thromboses confirmed on a Doppler 06/06/2016 3. Diabetes 4. Anorexia/weight loss   Ms. Brennick is admitted after being diagnosed with bilateral pulmonary embolism and deep vein thromboses. She has anorexia/weight loss and right upper quadrant discomfort. The clinical presentation is consistent with a diagnosis of metastatic pancreas cancer. She is scheduled for a liver biopsy to confirm a diagnosis of pancreas cancer. I discussed the probable diagnosis and treatment options with Ms. Kushman and her son. We discussed supportive/comfort care versus a trial of systemic chemotherapy. She understands no therapy will be curative.  Recommendations: 1. Proceed with biopsy of a liver lesion to confirm a diagnosis of pancreas cancer 2. Anticoagulation with Lovenox or Xarelto after the liver biopsy 3. Follow-up CA 19-9 level 4. I will continue following Ms. Kubina in the hospital and we  will schedule an outpatient appointment.    Betsy Coder, MD 06/07/2016, 1:57 PM

## 2016-06-07 NOTE — Progress Notes (Signed)
Per Insurance check for Eliquis S/W Wednesday  @ [facility] # [id number]   ELIQUIS 5 MG BID   COVER- YES  CO-PAY- $ 126.40  PRIOR APPROVAL- NO   [facility]Y : [facility]

## 2016-06-07 NOTE — Telephone Encounter (Signed)
Call received from patient's son concerned that his mother's biopsy is not scheduled for another couple of days d/t blood thinner medicine and would like to speak with Dr. Benay Spice.  Dr. Benay Spice notified.

## 2016-06-08 ENCOUNTER — Inpatient Hospital Stay (HOSPITAL_COMMUNITY): Payer: Medicare Other

## 2016-06-08 DIAGNOSIS — I619 Nontraumatic intracerebral hemorrhage, unspecified: Secondary | ICD-10-CM

## 2016-06-08 DIAGNOSIS — I63422 Cerebral infarction due to embolism of left anterior cerebral artery: Secondary | ICD-10-CM

## 2016-06-08 LAB — GLUCOSE, CAPILLARY
GLUCOSE-CAPILLARY: 333 mg/dL — AB (ref 65–99)
GLUCOSE-CAPILLARY: 241 mg/dL — AB (ref 65–99)
GLUCOSE-CAPILLARY: 190 mg/dL — AB (ref 65–99)
GLUCOSE-CAPILLARY: 168 mg/dL — AB (ref 65–99)
Glucose-Capillary: 209 mg/dL — ABNORMAL HIGH (ref 65–99)

## 2016-06-08 LAB — CBC
HCT: 35.1 % — ABNORMAL LOW (ref 36.0–46.0)
Hemoglobin: 11.7 g/dL — ABNORMAL LOW (ref 12.0–15.0)
MCH: 28.1 pg (ref 26.0–34.0)
MCHC: 33.3 g/dL (ref 30.0–36.0)
MCV: 84.2 fL (ref 78.0–100.0)
Platelets: 59 K/uL — ABNORMAL LOW (ref 150–400)
RBC: 4.17 MIL/uL (ref 3.87–5.11)
RDW: 16.5 % — ABNORMAL HIGH (ref 11.5–15.5)
WBC: 15 K/uL — ABNORMAL HIGH (ref 4.0–10.5)

## 2016-06-08 LAB — APTT: aPTT: 109 s — ABNORMAL HIGH (ref 24–36)

## 2016-06-08 LAB — HEPARIN LEVEL (UNFRACTIONATED): Heparin Unfractionated: 0.98 [IU]/mL — ABNORMAL HIGH (ref 0.30–0.70)

## 2016-06-08 MED ORDER — IBUPROFEN 200 MG PO TABS: 600.0000 mg | ORAL_TABLET | Freq: Four times a day (QID) | ORAL | Status: DC | PRN

## 2016-06-08 MED ORDER — IRBESARTAN-HYDROCHLOROTHIAZIDE 150-12.5 MG PO TABS: 1.0000 | ORAL_TABLET | Freq: Two times a day (BID) | ORAL | Status: DC

## 2016-06-08 MED ORDER — TRAMADOL HCL 50 MG PO TABS
50.0000 mg | ORAL_TABLET | Freq: Three times a day (TID) | ORAL | Status: DC
Start: 2016-06-08 — End: 2016-06-10
  Administered 2016-06-08 – 2016-06-10 (×4): 50 mg via ORAL
  Filled 2016-06-08 (×4): qty 1

## 2016-06-08 MED ORDER — ENSURE ENLIVE PO LIQD: 237.0000 mL | Freq: Two times a day (BID) | ORAL | Status: DC | PRN

## 2016-06-08 MED ORDER — SODIUM CHLORIDE 0.9 % IV SOLN
INTRAVENOUS | Status: DC
  Administered 2016-06-08: 1000 mL via INTRAVENOUS

## 2016-06-08 MED ORDER — INSULIN GLARGINE 100 UNIT/ML ~~LOC~~ SOLN
25.0000 [IU] | Freq: Every day | SUBCUTANEOUS | Status: DC
  Administered 2016-06-08 – 2016-06-10 (×3): 25 [IU] via SUBCUTANEOUS
  Filled 2016-06-08 (×5): qty 0.25

## 2016-06-08 MED ORDER — HYDROCHLOROTHIAZIDE 12.5 MG PO CAPS: 12.5000 mg | ORAL_CAPSULE | Freq: Two times a day (BID) | ORAL | Status: DC

## 2016-06-08 MED ORDER — WHITE PETROLATUM GEL
Status: AC
  Administered 2016-06-08: 1
  Filled 2016-06-08: qty 1

## 2016-06-08 MED ORDER — IOPAMIDOL (ISOVUE-370) INJECTION 76%
INTRAVENOUS | Status: AC
  Administered 2016-06-08: 90 mL via INTRAVENOUS
  Filled 2016-06-08: qty 100

## 2016-06-08 MED ORDER — IRBESARTAN 150 MG PO TABS
150.0000 mg | ORAL_TABLET | Freq: Two times a day (BID) | ORAL | Status: DC
  Administered 2016-06-08 – 2016-06-09 (×3): 150 mg via ORAL
  Filled 2016-06-08 (×3): qty 1

## 2016-06-08 MED ORDER — SODIUM CHLORIDE 0.9 % IV SOLN
INTRAVENOUS | Status: DC
  Administered 2016-06-08: 12:00:00 via INTRAVENOUS

## 2016-06-08 NOTE — Consult Note (Signed)
Referring Physician:  Dr. Erin Hearing    Chief Complaint: Acute onset of right hemiparesis and aphasia  HPI: Samantha Norton is an 83 y.o. female who was admitted for dyspnea and found to have acute pulmonary embolism. She was started on heparin drip. Also noted to have liver lesions with possible metastases to her lungs. Diagnosed with hypercoagulable state, likely due to malignancy. This morning at 6:30 AM, she was noted to be aphasic with right sided weakness, leg worse than arm. LKN was 2100 last night.   LSN: 2100 tPA Given: No, due to anticoagulation  Past Medical History:  Diagnosis Date  . Anemia    thrombocytopenia, seen by hematology.  . Diabetes mellitus    no meds  . Diverticulosis of colon   . GERD (gastroesophageal reflux disease)    no medications. Last EGD 2+ years ago. Had H. Pylori  . Hyperlipidemia   . Hypertension   . Osteoporosis    by patient report - osteopenia  . Varicella    in high school  . Wears dentures    top  . Wears glasses     Past Surgical History:  Procedure Laterality Date  . ABDOMINAL HYSTERECTOMY     partial, due to fibroid tumors.  Marland Kitchen BREAST LUMPECTOMY     bilateral biospsies over time x 3-4 , always beng  . FASCIOTOMY Right 03/14/2013   Procedure: FASCIOTOMY RIGHT RING FINGER;  Surgeon: Wynonia Sours, MD;  Location: [facility];  Service: Orthopedics;  Laterality: Right;  . tonsilectomy      Family History  Problem Relation Age of Onset  . Hypertension Mother   . Stroke Mother   . Diabetes Mother   . Stroke Father   . Hypertension Father   . Arthritis Brother     hip problems, in w/c  . Cancer Daughter     breast   Social History:  reports that she quit smoking about 44 years ago. She has never used smokeless tobacco. She reports that she drinks alcohol. She reports that she does not use drugs.  Allergies: No Known Allergies  Medications:  Scheduled: . feeding supplement (ENSURE ENLIVE)  237 mL Oral BID BM  .  irbesartan  150 mg Oral BID   And  . hydrochlorothiazide  12.5 mg Oral BID  . insulin aspart  0-15 Units Subcutaneous TID WC  . insulin glargine  20 Units Subcutaneous Q breakfast  . iopamidol      . sodium chloride flush  3 mL Intravenous Q12H  . sodium chloride flush  3 mL Intravenous Q12H    ROS: Unable to obtain due to aphasia.   Physical Examination: Blood pressure (!) 166/92, pulse 77, temperature 98.5 F (36.9 C), temperature source Oral, resp. rate 20, height 5' 1"  (1.549 m), weight 58.6 kg (129 lb 3.2 oz), SpO2 98 %.  Neurologic Examination: Ment: Awake and alert. Expressive aphasia. Able to follow about 50% of simple motor commands. Mild right sided neglect.  CN: PERRL. Blinks to threat both visual fields. EOMI with spontaneous saccades to visual stimuli on the right and left. No nystagmus. Reacts to CN V stimulation bilaterally. Subtle right facial droop. Hearing intact to some commands. No attempts to verbalize.  Motor: RUE 4/5 strength with drift when held at 30 degrees.  RLE 2-3/5 hip flexion and knee extension LUE and LLE 5/5 Sensory: Reacts to stimuli less briskly on right than left. Reflexes: 1+ achilles and patellae bilaterally. 2+ brachioradialis and biceps bilaterally. Toes  equivocal.   Cerebellar: No ataxia with left FNF. Does not follow command for FNF on right; no gross ataxia noted.  Gait: Deferred due to acuity of presentation.   Results for orders placed or performed during the hospital encounter of 06/04/16 (from the past 48 hour(s))  Glucose, capillary     Status: Abnormal   Collection Time: 06/06/16  9:01 AM  Result Value Ref Range   Glucose-Capillary 188 (H) 65 - 99 mg/dL  Glucose, capillary     Status: Abnormal   Collection Time: 06/06/16 11:03 AM  Result Value Ref Range   Glucose-Capillary 248 (H) 65 - 99 mg/dL  Glucose, capillary     Status: Abnormal   Collection Time: 06/06/16  5:08 PM  Result Value Ref Range   Glucose-Capillary 372 (H) 65 - 99  mg/dL  Glucose, capillary     Status: Abnormal   Collection Time: 06/06/16  9:08 PM  Result Value Ref Range   Glucose-Capillary 457 (H) 65 - 99 mg/dL   Comment 1 Notify RN   CBC     Status: Abnormal   Collection Time: 06/07/16  2:22 AM  Result Value Ref Range   WBC 15.6 (H) 4.0 - 10.5 K/uL   RBC 3.84 (L) 3.87 - 5.11 MIL/uL   Hemoglobin 10.6 (L) 12.0 - 15.0 g/dL   HCT 32.5 (L) 36.0 - 46.0 %   MCV 84.6 78.0 - 100.0 fL   MCH 27.6 26.0 - 34.0 pg   MCHC 32.6 30.0 - 36.0 g/dL   RDW 15.9 (H) 11.5 - 15.5 %   Platelets 81 (L) 150 - 400 K/uL    Comment: PLATELET COUNT CONFIRMED BY SMEAR  Comprehensive metabolic panel     Status: Abnormal   Collection Time: 06/07/16  2:22 AM  Result Value Ref Range   Sodium 139 135 - 145 mmol/L   Potassium 3.4 (L) 3.5 - 5.1 mmol/L   Chloride 104 101 - 111 mmol/L   CO2 29 22 - 32 mmol/L   Glucose, Bld 268 (H) 65 - 99 mg/dL   BUN 21 (H) 6 - 20 mg/dL   Creatinine, Ser 0.86 0.44 - 1.00 mg/dL   Calcium 8.7 (L) 8.9 - 10.3 mg/dL   Total Protein 4.8 (L) 6.5 - 8.1 g/dL   Albumin 2.0 (L) 3.5 - 5.0 g/dL   AST 87 (H) 15 - 41 U/L   ALT 81 (H) 14 - 54 U/L   Alkaline Phosphatase 253 (H) 38 - 126 U/L   Total Bilirubin 1.5 (H) 0.3 - 1.2 mg/dL   GFR calc non Af Amer >60 >60 mL/min   GFR calc Af Amer >60 >60 mL/min    Comment: (NOTE) The eGFR has been calculated using the CKD EPI equation. This calculation has not been validated in all clinical situations. eGFR's persistently <60 mL/min signify possible Chronic Kidney Disease.    Anion gap 6 5 - 15  Glucose, capillary     Status: Abnormal   Collection Time: 06/07/16  6:24 AM  Result Value Ref Range   Glucose-Capillary 209 (H) 65 - 99 mg/dL  Glucose, capillary     Status: Abnormal   Collection Time: 06/07/16  8:09 AM  Result Value Ref Range   Glucose-Capillary 202 (H) 65 - 99 mg/dL  Glucose, capillary     Status: Abnormal   Collection Time: 06/07/16 11:46 AM  Result Value Ref Range   Glucose-Capillary 276  (H) 65 - 99 mg/dL  APTT     Status: Abnormal  Collection Time: 06/07/16  8:27 PM  Result Value Ref Range   aPTT 118 (H) 24 - 36 seconds    Comment:        IF BASELINE aPTT IS ELEVATED, SUGGEST PATIENT RISK ASSESSMENT BE USED TO DETERMINE APPROPRIATE ANTICOAGULANT THERAPY.   CBC     Status: Abnormal   Collection Time: 06/08/16  4:59 AM  Result Value Ref Range   WBC 15.0 (H) 4.0 - 10.5 K/uL   RBC 4.17 3.87 - 5.11 MIL/uL   Hemoglobin 11.7 (L) 12.0 - 15.0 g/dL   HCT 35.1 (L) 36.0 - 46.0 %   MCV 84.2 78.0 - 100.0 fL   MCH 28.1 26.0 - 34.0 pg   MCHC 33.3 30.0 - 36.0 g/dL   RDW 16.5 (H) 11.5 - 15.5 %   Platelets 59 (L) 150 - 400 K/uL    Comment: REPEATED TO VERIFY CONSISTENT WITH PREVIOUS RESULT   APTT     Status: Abnormal   Collection Time: 06/08/16  4:59 AM  Result Value Ref Range   aPTT 109 (H) 24 - 36 seconds    Comment:        IF BASELINE aPTT IS ELEVATED, SUGGEST PATIENT RISK ASSESSMENT BE USED TO DETERMINE APPROPRIATE ANTICOAGULANT THERAPY.   Heparin level (unfractionated)     Status: Abnormal   Collection Time: 06/08/16  4:59 AM  Result Value Ref Range   Heparin Unfractionated 0.98 (H) 0.30 - 0.70 IU/mL    Comment: RESULTS CONFIRMED BY MANUAL DILUTION        IF HEPARIN RESULTS ARE BELOW EXPECTED VALUES, AND PATIENT DOSAGE HAS BEEN CONFIRMED, SUGGEST FOLLOW UP TESTING OF ANTITHROMBIN III LEVELS.    No results found.  Assessment: 83 y.o. female with acute onset of right hemiparesis and expressive aphasia. 1. Exam reveals leg worse than arm weakness on the right, correlating with CTP finding of decreased perfusion in the left ACA territory.  2. CTA without LVO. Not an interventional candidate.  3. CT head reveals no acute hypodensity supratentorially. A small right cerebellar hemorrhage with a small amount of perilesional edema is noted.  4. PE on heparin gtt.  5. Not an IV tPA candidate 6. Possible liver CA with lung metastases.  7. Possible underlying  hypercoagulable state given #6, above.   Plan: 1. Treatment dilemma regarding heparin gtt continuation given PE, in the setting of small subacute right cerebellar hemorrhage. Risk of fatal outcome if PE is untreated (if heparin is discontinued) is felt to be higher than risk of expansion of right cerebellar hemorrhage if heparin is continued.  2. Frequent neuro checks 3. PT consult, OT consult, Speech consult 4. Telemetry monitoring 5. Repeat CT head in 24 hours.  6. Risk factor modification  @Electronically  signed: Dr. Kerney Elbe  06/08/2016, 6:48 AM

## 2016-06-08 NOTE — Progress Notes (Signed)
  Interim Progress Note  Spoke at length with family and patient with attending Dr. Andria Frames about patient's current medical problems and prognosis. Family in favor of pursuing comfort care. Awaiting family from [place] to arrive tomorrow. Would like to have home hospice set up prior to leaving hospital. Most concerned about patient not having pain. Will continue to provide support and set patient up with appropriate resources. Plan to cancel IR liver biopsy/cyst drainage scheduled for tomorrow. Will follow closely.   Lucila Maine, DO PGY-1, [facility] 06/08/2016 4:03 PM  Pager [phone]

## 2016-06-08 NOTE — Progress Notes (Signed)
PT Cancellation Note  Patient Details Name: Samantha Norton MRN: [id number] DOB: [birth year]   Cancelled Treatment:    Reason Eval/Treat Not Completed: Medical issues which prohibited therapy. Code stroke was called on pt this AM and found an acute R cerebellar hemorrhage with L ACA and MCA infarcts. Pt with known diagnosis of metastatic lung and liver cancers. Palliative care consult scheduled for this afternoon. PT awaiting result of palliative care consult prior to initiating mobilization.    Samantha Norton Samantha Norton 06/08/2016, 3:18 PM  Tracie Harrier, SPT Acute Rehab SPT [phone]

## 2016-06-08 NOTE — Progress Notes (Signed)
Family Medicine Teaching Service Daily Progress Note Intern Pager: [phone]  Patient name: Samantha Norton Medical record number: [id number] Date of birth: 01/08/1932 Age: 83 y.o. Gender: female  Primary Care Provider: Adin Hector, MD Consultants: Palliative, chaplain, IR Code Status: DNR  Pt Overview and Major Events to Date:  2/23 - Admitted with dyspnea and n/v 2/27- acute hemorrhagic stroke in right cerebellum, transfer to neuro floor  Assessment and Plan: Samantha Norton is a 83 y.o. female presenting with SOB and n/v. PMH is significant for HTN, HLD, anemia, T2DM, thrombocytopenia, and liver lesion.   Dysarthria and right leg weakness- noted early morning on 2/27 by nursing staff when helping patient to bathroom. Rapid response/code stroke called. CT head with high-grade nonocclusive stenosis at the origin of the LEFT callosomarginal artery, LEFT ACA A3 segment. RIGHT cerebellar hemorrhage redemonstrated. Neurology saw patient. Patient currently stable.  -neuro following appreciate recommendations -transfer patient to stroke floor/higher level of care -neuro checks q2 hours for 12 hours then q4 hours -swallow screen -SLP consult -PT/OT -hold BP medications for next 48 hours -repeat head CT in 24 hours per neuro (am 2/28) -held heparin drip at this time, will reassess need for anticoagulation in setting of PE and small brain bleed -palliative already consulted for malignancy concerns, would appreciate continued family support and recommendations -plan to have family meeting this afternoon if able to reach son  Dyspnea in setting of Acute PE and probable metastatic cancer to lungs- improving- CTA chest showed small segmental pulmonary emboli in the right lower lobe and left upper lobe. No CHF as Echo normal. Liver lesion could also be contributing to dyspnea as this is pushing against her IVC.  -monitor on telemetry with continuous pulse ox -vitals per floor -heparin drip held  in setting of acute stroke, will discuss with neurology best plan for continuing anticoagulation in setting of small brain bleed -up with assistance  Pancreatic tail mass, Liver lesions- Largest measuring 20 cm, likely malignant in etiology per radiology read, evidence of possible metastasis to lungs. Also multiple other hepatic lesions. Elevated alk phos, LFT's, lactic acid on admission. Albumin low. Likely advanced disease. AFP normal.  -IR plan to biopsy am of 2/28, hold heparin drip prior, NPO midnight -cancer markers pending- CEA, Ca 19-9  -consult to palliative care, oncology, and chaplin per patient wishes  Lower extremity swelling- improving- secondary to liver mass compressing IVC. Echo with compression of IVC, normal EF, G1DD -monitor fluid status -elevate legs and add compression stockings -hold off on diuresis and extra fluids for now  Lactic acidosis- resolved- Lactic acid elevated on admission to 2.1 > 2.7>1.7. No obvious source of infection. No signs of pneumonia or UTI on imaging/urinalysis. Could be due to malignancy.  -Resolved  Leukocytosis- persistent- WBC elevated to 17.3 on admission. Trend > 16.6 > 17.3 > 15.6. -continue to monitor, no labs collected this morning -monitor for signs of infection  Abnormal CTA Findings- Evidence of 4.1 cm ascending aortic aneurysm. Multiple pulmonary nodules. Multiple ill-defined hypodense liver lesions suspicious for neoplasm with giant hepatic lesion measuring 20 cm. Possible distal esophageal and GE junction thickening; further evaluation with endoscopy could be obtained. -consider endoscopy in future -recommend follow up annual CTA/MR for aortic aneurysm- monitor closely for signs of bleeding  HTN- stable- On Avalide (irbesartan-HCTZ 150-12.5 BID) at home. BP this morning 151/89 -hold home BP meds in setting of acute stroke  T2DM- uncontrolled- Last A1C 14.3 in November 2017. CBG's have been elevated. A1C  12.8 -increase  Lantus to 20 units this morning -repeat A1C 12.8 -CBGs q4hours while NPO  FEN/GI: NPO pending swallow evaluation Prophylaxis: holding anticoagulation in setting of hemorrhagic stroke  Disposition: transfer to neuro floor, continue inpatient management  Subjective:  Samantha Norton is unable to speak this morning. Can shake/nod head yes or no to questions. Reports pain in her chest and head. Cannot move right leg. Can understand and follow commands. No family at bedside. Per nursing staff, son left before changes in mental status were noticed and have been unable to reach him  Objective: Temp:  [98.5 F (36.9 C)-99 F (37.2 C)] 98.5 F (36.9 C) (02/27 0442) Pulse Rate:  [69-88] 77 (02/27 0442) Resp:  [18-20] 20 (02/27 0442) BP: (137-166)/(88-97) 166/92 (02/27 0442) SpO2:  [97 %-99 %] 98 % (02/27 0442) Physical Exam: General: elderly lady laying in bed in NAD Cardiovascular: RRR no MRG Respiratory: CTAB no increased work of breathing, on room air Gastrointestinal: hepatomegaly, Non-tender. Non-distended. No guarding or rebound MSK: +1 pitting edema in feet bilaterally.  Neuro: non-verbal. 4/5 strength in right upper extremity. 0/5 strength in right lower extremity. 5/5 on left upper and lower extremities. Finger to nose normal.   Laboratory:  Recent Labs Lab 06/06/16 0314 06/07/16 0222 06/08/16 0459  WBC 17.3* 15.6* 15.0*  HGB 11.8* 10.6* 11.7*  HCT 34.4* 32.5* 35.1*  PLT 61* 81* 59*    Recent Labs Lab 06/04/16 1418 06/05/16 0215 06/07/16 0222  NA 138 140 139  K 3.5 3.2* 3.4*  CL 97* 100* 104  CO2 28 31 29   BUN 27* 27* 21*  CREATININE 0.94 0.97 0.86  CALCIUM 9.6 9.0 8.7*  PROT 6.0* 5.3* 4.8*  BILITOT 2.1* 1.7* 1.5*  ALKPHOS 275* 232* 253*  ALT 84* 70* 81*  AST 90* 73* 87*  GLUCOSE 440* 390* 268*   TSH 1.2 D-dimer >20 BNP 313 I-stat trop neg Lactic Acid 2.71 >>1.7 A1C 12.8 AFP 3.9 (normal)  Imaging/Diagnostic Tests: Ct Angio Head W Or Wo Contrast  Result  Date: 06/08/2016 CLINICAL DATA:  Aphasia.  RIGHT hemiparesis. EXAM: CT ANGIOGRAPHY HEAD AND NECK TECHNIQUE: Multidetector CT imaging of the head and neck was performed using the standard protocol during bolus administration of intravenous contrast. Multiplanar CT image reconstructions and MIPs were obtained to evaluate the vascular anatomy. Carotid stenosis measurements (when applicable) are obtained utilizing NASCET criteria, using the distal internal carotid diameter as the denominator. CONTRAST:  Isovue 370, 50 mL. COMPARISON:  CT perfusion reported separately. FINDINGS: CTA NECK Aortic arch: Standard branching. Imaged portion shows no evidence of aneurysm or dissection. No significant stenosis of the major arch vessel origins. Right carotid system: Minor atheromatous change at the bifurcation. No evidence of dissection, stenosis (50% or greater) or occlusion. Left carotid system: Minor atheromatous change the bifurcation. No evidence of dissection, stenosis (50% or greater) or occlusion. Vertebral arteries: Dominant LEFT vertebral Diminutive RIGHT vertebral. No evidence of dissection, stenosis (50% or greater) or occlusion. CTA HEAD Anterior circulation: No significant stenosis of the internal carotid arteries. Both middle cerebral arteries are widely patent. There is a high-grade stenosis at the origin of the LEFT callosomarginal artery from the pericallosal artery, LEFT anterior cerebral artery A3 segment. This is nonocclusive. No  aneurysm, or vascular malformation. Posterior circulation: LEFT vertebral dominant contributor to basilar; RIGHT vertebral contributes to PICA. No significant stenosis, proximal occlusion, aneurysm, or vascular malformation. Venous sinuses: As permitted by contrast timing, patent. Anatomic variants: None of significance. Post infusion imaging the head  was not performed, but on axial thin-section imaging, RIGHT cerebellar hemorrhage redemonstrated. Review of the MIP images confirms  the above findings IMPRESSION: No extracranial or proximal intracranial stenosis of significance. High-grade nonocclusive stenosis at the origin of the LEFT callosomarginal artery, LEFT ACA A3 segment. RIGHT cerebellar hemorrhage redemonstrated. Recommend continued surveillance. Electronically Signed   By: Staci Righter M.D.   On: 06/08/2016 08:11   Ct Angio Neck W Or Wo Contrast  Result Date: 06/08/2016 CLINICAL DATA:  Aphasia.  RIGHT hemiparesis. EXAM: CT ANGIOGRAPHY HEAD AND NECK TECHNIQUE: Multidetector CT imaging of the head and neck was performed using the standard protocol during bolus administration of intravenous contrast. Multiplanar CT image reconstructions and MIPs were obtained to evaluate the vascular anatomy. Carotid stenosis measurements (when applicable) are obtained utilizing NASCET criteria, using the distal internal carotid diameter as the denominator. CONTRAST:  Isovue 370, 50 mL. COMPARISON:  CT perfusion reported separately. FINDINGS: CTA NECK Aortic arch: Standard branching. Imaged portion shows no evidence of aneurysm or dissection. No significant stenosis of the major arch vessel origins. Right carotid system: Minor atheromatous change at the bifurcation. No evidence of dissection, stenosis (50% or greater) or occlusion. Left carotid system: Minor atheromatous change the bifurcation. No evidence of dissection, stenosis (50% or greater) or occlusion. Vertebral arteries: Dominant LEFT vertebral Diminutive RIGHT vertebral. No evidence of dissection, stenosis (50% or greater) or occlusion. CTA HEAD Anterior circulation: No significant stenosis of the internal carotid arteries. Both middle cerebral arteries are widely patent. There is a high-grade stenosis at the origin of the LEFT callosomarginal artery from the pericallosal artery, LEFT anterior cerebral artery A3 segment. This is nonocclusive. No  aneurysm, or vascular malformation. Posterior circulation: LEFT vertebral dominant  contributor to basilar; RIGHT vertebral contributes to PICA. No significant stenosis, proximal occlusion, aneurysm, or vascular malformation. Venous sinuses: As permitted by contrast timing, patent. Anatomic variants: None of significance. Post infusion imaging the head was not performed, but on axial thin-section imaging, RIGHT cerebellar hemorrhage redemonstrated. Review of the MIP images confirms the above findings IMPRESSION: No extracranial or proximal intracranial stenosis of significance. High-grade nonocclusive stenosis at the origin of the LEFT callosomarginal artery, LEFT ACA A3 segment. RIGHT cerebellar hemorrhage redemonstrated. Recommend continued surveillance. Electronically Signed   By: Staci Righter M.D.   On: 06/08/2016 08:11   Ct Cerebral Perfusion W Contrast  Result Date: 06/08/2016 CLINICAL DATA:  New onset of aphasia.  RIGHT-sided weakness. EXAM: CT PERFUSION BRAIN TECHNIQUE: Multiphase CT imaging of the brain was performed following IV bolus contrast injection. Subsequent parametric perfusion maps were calculated using RAPID software. CONTRAST:  40 mL Isovue 370. COMPARISON:  Code stroke CT reported earlier today. CTA head neck reported separately. FINDINGS: CT Brain Perfusion Findings: CBF (<30%) Volume: 9m Perfusion (Tmax>6.0s) volume: 231mMismatch Volume: 2525mnfarction/ischemic Location:Medial frontal lobe, LEFT anterior cerebral artery territory. IMPRESSION: LEFT medial frontal lobe ACA territory mismatch, 25 mL as described. Findings discussed with Stroke neurologist at 7:40 a.m. Electronically Signed   By: JohStaci RighterD.   On: 06/08/2016 07:48   Ct Head Code Stroke Wo Contrast  Result Date: 06/08/2016 CLINICAL DATA:  Code stroke. RIGHT-sided weakness and aphasia. Hypertension. No reported recent fall. Patient on anticoagulation for DVT. EXAM: CT HEAD WITHOUT CONTRAST TECHNIQUE: Contiguous axial images were obtained from the base of the skull through the vertex without  intravenous contrast. COMPARISON:  None. FINDINGS: Brain: Roughly 1 cm size hemorrhage, RIGHT cerebellar subcortical white matter, mild surrounding edema, slight extension to the tentorium  on the RIGHT. No visible acute stroke, mass lesion, or hydrocephalus. Moderate atrophy. Chronic microvascular ischemic change of the white matter. Vascular: Carotid siphon vascular calcification. No hyperdense vessel. Skull: Normal. Negative for fracture or focal lesion. Sinuses/Orbits: No acute finding. Other: None. ASPECTS [facility] Stroke Program Early CT Score) - Ganglionic level infarction (caudate, lentiform nuclei, internal capsule, insula, M1-M3 cortex): 7 - Supraganglionic infarction (M4-M6 cortex): 3 Total score (0-10 with 10 being normal): 10 IMPRESSION: 1. Acute 1 cm hemorrhage RIGHT cerebellum, with some extension to the RIGHT tentorium. This could be spontaneous (due to anticoagulation), or hypertensive. Trauma not excluded. 2. ASPECTS is 10. Critical Value/emergent results were called by telephone at the time of interpretation on 06/08/2016 at 7:05 Am to Dr. Kerney Elbe , who verbally acknowledged these results. Electronically Signed   By: Staci Righter M.D.   On: 06/08/2016 07:17    Steve Rattler, DO 06/08/2016, 7:33 AM PGY-1, [place] Intern pager: [phone], text pages welcome

## 2016-06-08 NOTE — Care Management Note (Addendum)
Case Management Note Marvetta Gibbons RN, BSN Unit 2W-Case Manager [phone]  Patient Details  Name: STARLING ZEE MRN: [id number] Date of Birth: 1932/04/09  Subjective/Objective:  Pt admitted with PE/DVT-   ?metastatic cancer to lung pending bx- plan was for eliquis after bx completed-             Action/Plan: PTA pt lived at home, independent- insurance check completed for Eliquis- copay $126.70- pt with Code Stroke on 2/27 iv heparin stopped- stroke work up now in progress- plan to tx pt to neuro- CM will follow for d/c needs pending improvement- PT/OT evals will need to be ordered post stroke. - [id number]- received consult for possible home hospice-PC to meet with family tomorrow AM- no family present in room currently pt to move to 78M per bedside RN. - will contact 78M CM to f/u with family after [facility] mtg regarding decision on home hospice.   Expected Discharge Date:   Expected Discharge Plan:  [place]  In-House Referral:     Discharge planning Services  CM Consult, Medication Assistance  Post Acute Care Choice:    Choice offered to:     DME Arranged:    DME Agency:     HH Arranged:    HH Agency:     Status of Service:  In process, will continue to follow  If discussed at Long Length of Stay Meetings, dates discussed:    Additional Comments:  Dawayne Patricia, RN 06/08/2016, 10:40 AM

## 2016-06-08 NOTE — Progress Notes (Signed)
STROKE TEAM PROGRESS NOTE   HISTORY OF PRESENT ILLNESS (per record) Samantha Norton is an 83 y.o. female who was admitted for dyspnea and found to have acute pulmonary embolism. She was started on heparin drip. Also noted to have liver lesions with possible metastases to her lungs. Diagnosed with hypercoagulable state, likely due to malignancy. This morning 06/08/2016 at 6:30 AM, she was noted to be aphasic with right sided weakness, leg worse than arm. LKW was 2100 last night 06/07/2016. Patient was not administered IV t-PA secondary to being on IV heparin. She was admitted for further evaluation and treatment.   SUBJECTIVE (INTERVAL HISTORY) No family at bedside. I talked with son over the phone and let him know pt current condition with multiple medical issues. We have to hold off heparin due to [place]. He understood pt current condition and medical dilemma. He is coming to hospital before 2pm and will meet with our palliative care specialist.    OBJECTIVE Temp:  [98.5 F (36.9 C)-99 F (37.2 C)] 98.8 F (37.1 C) (02/27 0742) Pulse Rate:  [69-88] 83 (02/27 0742) Cardiac Rhythm: Normal sinus rhythm (02/27 1000) Resp:  [18-20] 20 (02/27 0442) BP: (137-166)/(80-97) 148/80 (02/27 1031) SpO2:  [97 %-100 %] 100 % (02/27 1031)  CBC:   Recent Labs Lab 06/07/16 0222 06/08/16 0459  WBC 15.6* 15.0*  HGB 10.6* 11.7*  HCT 32.5* 35.1*  MCV 84.6 84.2  PLT 81* 59*    Basic Metabolic Panel:   Recent Labs Lab 06/05/16 0215 06/07/16 0222  NA 140 139  K 3.2* 3.4*  CL 100* 104  CO2 31 29  GLUCOSE 390* 268*  BUN 27* 21*  CREATININE 0.97 0.86  CALCIUM 9.0 8.7*    Lipid Panel:     Component Value Date/Time   CHOL 197 12/26/2012 0905   TRIG 68 12/26/2012 0905   HDL 63 12/26/2012 0905   CHOLHDL 3.1 12/26/2012 0905   VLDL 14 12/26/2012 0905   LDLCALC 120 (H) 12/26/2012 0905   HgbA1c:  Lab Results  Component Value Date   HGBA1C 12.8 (H) 06/05/2016   Urine Drug Screen: No results  found for: LABOPIA, COCAINSCRNUR, LABBENZ, AMPHETMU, THCU, LABBARB    IMAGING I have personally reviewed the radiological images below and agree with the radiology interpretations.  Ct Head Code Stroke Wo Contrast 06/08/2016 1. Acute 1 cm hemorrhage RIGHT cerebellum, with some extension to the RIGHT tentorium. This could be spontaneous (due to anticoagulation), or hypertensive. Trauma not excluded. 2. ASPECTS is 10.   Ct Angio Head W Or Wo Contrast Ct Angio Neck W Or Wo Contrast 06/08/2016 No extracranial or proximal intracranial stenosis of significance. High-grade nonocclusive stenosis at the origin of the LEFT callosomarginal artery, LEFT ACA A3 segment. RIGHT cerebellar hemorrhage redemonstrated.   Ct Cerebral Perfusion W Contrast 06/08/2016 LEFT medial frontal lobe ACA territory mismatch, 25 mL as described.   2-D echocardiogram - Left ventricle: The cavity size ws normal. There was mild focal basal hypertrophy of the septum. Systolic function was mildly reduced. The estimated ejection fraction was 50%. There is akinesis of the basal-midinferolateral myocardium. Doppler parameters are consistent with abnormal left ventricular relaxation (grade 1 diastolic dysfunction). - Mitral valve: There was severe regurgitation. - Left atrium: The atrium was moderately dilated. - Right ventricle: The cavity size was below normal. Wall thickness was normal. - Atrial septum: There was an atrial septal aneurysm. - Inferior vena cava: The vessel was small, appearing collapsed, consistent with low central venous pressure. The  vessel appeared externally compressed. Impressions:- LVEF 50% with inferolateral hypokinesis and severe MR. There appears to be a very large cystic structure arising from the liver that is compressing the IVC and the right ventricle. Suggest dedicated abdominal imaging.  Lower extremity Dopplers Findings consistent with acute deep vein thrombosis involving the common femoral,  femoral, popliteal, and posterior tibial veins of the right lower extremity and popliteal, posterior tibial, and peroneal veins of the left lower extremity.  CTA chest PE 06/04/16 1. Examination is positive for small sub segmental pulmonary emboli in the right lower lobe and left upper lobe. 2. Mild aneurysmal dilatation of the ascending aorta up to 4.1 cm. Recommend annual imaging followup by CTA or MRA. This recommendation follows 2010 ACCF/AHA/AATS/ACR/ASA/SCA/SCAI/SIR/STS/SVM Guidelines for the Diagnosis and Management of Patients with Thoracic Aortic Disease. Circulation. 2010; 121: [id number] 3. Multiple pulmonary nodules. Non-contrast chest CT at 3-6 months is recommended. If the nodules are stable at time of repeat CT, then future CT at 18-24 months (from today's scan) is considered optional for low-risk patients, but is recommended for high-risk patients. This recommendation follows the consensus statement: Guidelines for Management of Incidental Pulmonary Nodules Detected on CT Images: From the Fleischner Society 2017; Radiology 2017; 284:228-243. 4. Multiple ill-defined hypodense liver lesions suspicious for neoplasm. Giant hepatic cyst measuring 20 cm. 5. Possible distal esophageal and GE junction thickening; further evaluation with endoscopy could be obtained.   PHYSICAL EXAM  Temp:  [98.5 F (36.9 C)-99 F (37.2 C)] 98.8 F (37.1 C) (02/27 0742) Pulse Rate:  [69-88] 83 (02/27 0742) Resp:  [18-20] 20 (02/27 0442) BP: (137-166)/(80-97) 148/80 (02/27 1031) SpO2:  [97 %-100 %] 100 % (02/27 1031)  General - thin, well developed, in no apparent distress.  Ophthalmologic - Fundi not visualized due to noncooperation.  Cardiovascular - Regular rate and rhythm.  Neuro - Awake alert, able to following with most of the simple commands tested, but no speech output. Not able to name or repeat. No neglect. Blinking bilaterally for visual threat. PERRL, attending to both sides, no  gaze preference. Right mild facial droop. Did not open mouth or stick out tongue on command. LUE and LLE spontaneous movement against gravity, LUE 5/5, LLE at least 4/5. RUE drift with 4/5 proximal and distal, RLE flicker withdraw on pain stimulation. No babinski. DTR 1+. Sensation, coordination not cooperative and gait not tested.    ASSESSMENT/PLAN Ms. PORSCHIA VENTERS is a 83 y.o. female with history of HTN, HLD, anemia, DM, thrombocytopenia and liver lesion , admitted for days ago with shortness of breath, found to have acute pulmonary embolism, placed on IV heparin. Also found to have liver lesions with possible metastases to the lung. Dx with hypercoagulable state. This am she developed aphasia and right-sided weakness, leg greater than arm.    Ischemic stroke and cerebellar hemorrhage:  Symptom and imaging suggest left ACA and MCA infarcts likely due to hypercoagulability from metastatic liver cancer, but also found to have small right cerebellar hemorrhage in setting of IV heparin for B LE DVTs and PEs.   Resultant  aphasia and right hemiparesis  Code stroke CT 1cm R cerebellar hemorrhage with some extension to R tentorium. Aspects 10   CTA head and neck High-grade stenosis L callosomarginal artery, L ACA A3  CT perfusion L medial frontal lobe ACA territory mismatch 25 mL  2D Echo  EF 50%. Severe MR. Large cystic structure from liver compressing IVC and R ventricle. No obvious source of embolus   LE  doppler - bilateral DVTs  LDL not ordered  HgbA1c 12.8  Was on IV heparin for VTE prophylaxis, now off due to [place]. No SCDs due to b/l DVTs Diet NPO time specified Except for: Sips with Meds Diet NPO time specified  No antithrombotic prior to admission, was on heparin IV, now off due to [place]. Not recommending antithrombotics at this time.   Ongoing aggressive stroke risk factor management  Therapy recommendations:  pending   Disposition: pt neuro stable, no need to move to neuro ICU.  OK to continue monitoring at floor.   Discussed with son over the phone. Informed him that, on the one hand, pt needs anticoagulation for DVTs and PEs and hypercoagulable state due to advance cancer, however, on the other hand, we can not give to her anymore due to [place] in the setting of heparin IV. Currently respiratory stable, and PEs are small, will keep her off anticoagulation for now.   Pt likely to have new stroke at left MCA and ACA territories. With aphasia and right side weakness, advanced metastasis cancer, prognosis is poor, palliative care recommended. Son is going to discuss with palliative care today at 2pm for further goal of care. Will hold of MRI or IVC filter for now, pending palliative care consultation.    Pulmonary embolism, bilateral Deep vein thrombosis, bilateral  Secondary to hypercoagulable state from cancer  Was on IV herapin, now off due to [place]  Close monitoring  Hold off IVC filter and pending palliative care consult  If developed life threatening PE, may consider thrombectomy or comfort care depending on palliative care consult  Hypertension  Stable  BP goal normotensive  Hyperlipidemia  Home meds:  No statin  LDL not ordered, goal < 70  Not a statin candidate d/t liver cancer  Diabetes  HgbA1c 12.8, goal < 7.0  Uncontrolled  On lantus  SSI  Other Stroke Risk Factors  Advanced age  Former Cigarette smoker, quit 44 years ago  ETOH use, advised to drink no more than 1 drink(s) a day  Family hx stroke (mother, father)  Other Active Problems   Metastatic cancer to the lungs   Pancreatic temporal mass, liver lesions   Hospital day # 4  Rosalin Hawking, MD PhD Stroke Neurology 06/08/2016 11:00 AM   To contact Stroke Continuity provider, please refer to [email or web address] After hours, contact General Neurology

## 2016-06-08 NOTE — Evaluation (Signed)
Clinical/Bedside Swallow Evaluation Patient Details  Name: Samantha Norton MRN: [id number] Date of Birth: 03-02-1932  Today's Date: 06/08/2016 Time: SLP Start Time (ACUTE ONLY): 1000 SLP Stop Time (ACUTE ONLY): 1015 SLP Time Calculation (min) (ACUTE ONLY): 15 min  Past Medical History:  Past Medical History:  Diagnosis Date  . Anemia    thrombocytopenia, seen by hematology.  . Diabetes mellitus    no meds  . Diverticulosis of colon   . GERD (gastroesophageal reflux disease)    no medications. Last EGD 2+ years ago. Had H. Pylori  . Hyperlipidemia   . Hypertension   . Osteoporosis    by patient report - osteopenia  . Varicella    in high school  . Wears dentures    top  . Wears glasses    Past Surgical History:  Past Surgical History:  Procedure Laterality Date  . ABDOMINAL HYSTERECTOMY     partial, due to fibroid tumors.  Marland Kitchen BREAST LUMPECTOMY     bilateral biospsies over time x 3-4 , always beng  . FASCIOTOMY Right 03/14/2013   Procedure: FASCIOTOMY RIGHT RING FINGER;  Surgeon: Wynonia Sours, MD;  Location: [facility];  Service: Orthopedics;  Laterality: Right;  . tonsilectomy     HPI:  83 y.o.femalewho was admitted 2/23 for dyspnea and found to have acute pulmonary embolism. She was started on heparin drip. Also noted to have liver lesions with possible metastases to her lungs. Diagnosed with hypercoagulable state, likely due to malignancy. 2/27 6:30 AM, she was noted to be aphasic with right sided weakness, leg worse than arm.   Assessment / Plan / Recommendation Clinical Impression  Bedside swallow evaluation completed after acute onset CVA.  Intermittently following commands, no purposeful speech, but spontaneous voice noted.  Pt without focal CN deficits.  Consumed solids with adequate attention and mastication; brisk swallow response present.  No overt s/s of aspiration observed; pt passed three oz water test without difficulty.  No dysphagia.  Recommend  resuming regular diet, thin liquids.  Pt with aphasia - will follow for speech language evaluation.   SLP Visit Diagnosis: Dysphagia, unspecified (R13.10)    Aspiration Risk  No limitations    Diet Recommendation   regular diet, thin liquids  Medication Administration: Whole meds with liquid    Other  Recommendations Oral Care Recommendations: Oral care BID   Follow up Recommendations        Frequency and Duration            Prognosis        Swallow Study   General Date of Onset: 06/08/16 HPI: 83 y.o.femalewho was admitted 2/23 for dyspnea and found to have acute pulmonary embolism. She was started on heparin drip. Also noted to have liver lesions with possible metastases to her lungs. Diagnosed with hypercoagulable state, likely due to malignancy. 2/27 6:30 AM, she was noted to be aphasic with right sided weakness, leg worse than arm. Type of Study: Bedside Swallow Evaluation Previous Swallow Assessment: no Diet Prior to this Study: NPO Temperature Spikes Noted: No Respiratory Status: Room air History of Recent Intubation: No Behavior/Cognition: Alert;Cooperative Oral Cavity Assessment: Within Functional Limits Oral Care Completed by SLP: No Oral Cavity - Dentition: Adequate natural dentition Vision: Functional for self-feeding Patient Positioning: Upright in bed Baseline Vocal Quality: Normal Volitional Cough: Cognitively unable to elicit Volitional Swallow: Unable to elicit    Oral/Motor/Sensory Function Overall Oral Motor/Sensory Function: Within functional limits   Ice Chips Ice chips: Within  functional limits   Thin Liquid Thin Liquid: Within functional limits Presentation: Cup;Straw    Nectar Thick Nectar Thick Liquid: Not tested   Honey Thick Honey Thick Liquid: Not tested   Puree Puree: Within functional limits   Solid   GO   Solid: Within functional limits       Araf Clugston L. Tivis Ringer, [place] CCC/SLP Pager [phone]  Juan Quam  Laurice 06/08/2016,11:02 AM

## 2016-06-08 NOTE — Progress Notes (Signed)
ANTICOAGULATION CONSULT NOTE - Follow Up Consult  Pharmacy Consult for heparin Indication: pulmonary embolus  Labs:  Recent Labs  06/05/16 0841  06/06/16 0314 06/07/16 0222 06/07/16 2027 06/08/16 0459  HGB  --   < > 11.8* 10.6*  --  11.7*  HCT  --   --  34.4* 32.5*  --  35.1*  PLT  --   --  61* 81*  --  59*  APTT  --   --   --   --  118* 109*  HEPARINUNFRC 0.18*  --   --   --   --  0.98*  CREATININE  --   --   --  0.86  --   --   < > = values in this interval not displayed.   Assessment: 83yo female remains above goal on heparin after rate change.  Goal of Therapy:  aPTT 66-102 seconds   Plan:  Will decrease heparin gtt further to 700 units/hr and check level in [place], PharmD, BCPS  06/08/2016,6:49 AM

## 2016-06-08 NOTE — Significant Event (Signed)
Rapid Response Event Note  Overview: Handoff from [facility] RRT RN - called at [place] for neuro changes Time Called: 0630 Arrival Time: 0632 Event Type: Neurologic  Initial Focused Assessment:  Per Nevin Bloodgood RN handoff - called at 0630 for neuro changes - per report - patient LSW at 2230 with med pass all was normal - son spent the night and this AM told staff she needed to go to bathroom - at that time it was discovered that she was not moving her right leg and she was mute.  Code stroke was called and RRT responded - patient was alert - mute - right leg flacid - right arm weak but moving - not following commands- airway clear - CBG 198.    Interventions: Stat CT scan - met by Dr. Cheral Marker in scan - post CT patient was able to follow simple commands - slight movement of right leg - remains mute.   #18 angiocath inserted right antecubital by Nevin Bloodgood RN times one attempt.  CT perfusion and CTA done.  Dr. Cheral Marker at bedside.  Call from radiologist with report of small cerebellum bleed - Heparin stopped immediately. Per Dr. Cheral Marker - no LVO for IR.   Back to 2[facility] - Dr. Vanetta Shawl to bedside.  12 lead EKG done per order.  Patient contines to follow simple commands - she is alert - endorses headache with nod yes - right leg once again flacid - mute - attempting to speak but no words.  Handoff to [employer].  To call as needed.     Plan of Care (if not transferred): every 2 hour neuro checks - to transfer to neuro floor or SDU per attending MD.    Event Summary: Name of Physician Notified: Dr. Vanetta Shawl at  (per [facility] staff)  Name of Consulting Physician Notified: Dr. Cheral Marker with code stroke call at 0630  Outcome: Transferred (Comment) (for transfer to neuro floor or SDU)  Event End Time: 0830  Quin Hoop

## 2016-06-08 NOTE — Progress Notes (Signed)
Around 0630, patient's son called out to tell the staff that his mother needed to go to the bathroom. This RN went into the room to find another nurse trying to get the patient to stand. Patient was unable to stand, was nonverbal, and could not move right leg. Called rapid response and code stroke was called. Patient was emergently taken to CT.

## 2016-06-09 ENCOUNTER — Telehealth: Payer: Self-pay | Admitting: Internal Medicine

## 2016-06-09 ENCOUNTER — Telehealth: Payer: Self-pay | Admitting: *Deleted

## 2016-06-09 DIAGNOSIS — Z515 Encounter for palliative care: Secondary | ICD-10-CM

## 2016-06-09 DIAGNOSIS — I6340 Cerebral infarction due to embolism of unspecified cerebral artery: Secondary | ICD-10-CM

## 2016-06-09 DIAGNOSIS — Z7189 Other specified counseling: Secondary | ICD-10-CM

## 2016-06-09 DIAGNOSIS — I824Z9 Acute embolism and thrombosis of unspecified deep veins of unspecified distal lower extremity: Secondary | ICD-10-CM

## 2016-06-09 DIAGNOSIS — I639 Cerebral infarction, unspecified: Secondary | ICD-10-CM

## 2016-06-09 LAB — CANCER ANTIGEN 19-9: CA 19-9: 19 U/mL (ref 0–35)

## 2016-06-09 LAB — GLUCOSE, CAPILLARY: GLUCOSE-CAPILLARY: 270 mg/dL — AB (ref 65–99)

## 2016-06-09 LAB — CEA: CEA: 29 ng/mL — ABNORMAL HIGH (ref 0.0–4.7)

## 2016-06-09 MED ORDER — POLYETHYLENE GLYCOL 3350 17 G PO PACK: 17.0000 g | PACK | Freq: Every day | ORAL | 2 refills | Status: AC | PRN

## 2016-06-09 NOTE — Progress Notes (Signed)
   06/09/16 1900  Clinical Encounter Type  Visited With Patient and family together;Health care provider  Visit Type Follow-up;Spiritual support  Referral From Nurse  Consult/Referral To Chaplain  Spiritual Encounters  Spiritual Needs Emotional  Stress Factors  Patient Stress Factors Family relationships  Family Stress Factors Family relationships;Health changes    Chaplain followed up on [place] consult during on call shift. Met with pt and family. Discussed AD in detail and pt and family not all on the same page. Pt wanted more time to discuss with family and will page on call chaplain if more questions arise. Provided emotional support and educate re:AD and future. Brondon Wann L. Volanda Napoleon, MDiv

## 2016-06-09 NOTE — Progress Notes (Signed)
SLP Cancellation Note  Patient Details Name: Samantha Norton MRN: [id number] DOB: [birth year]   Cancelled treatment:       Reason Eval/Treat Not Completed: Other (comment) - SLP language orders discontinued.  Pt to be D/Cd home with hospice   Juan Quam Laurice 06/09/2016, 12:30 PM

## 2016-06-09 NOTE — Progress Notes (Signed)
[facility] Liaison: RN   Notified by Viona Gilmore, of patient/family request for Hospice and [place] services at home after discharge.  Chart and patient information review with Dr. Brigid Re, [place] Director.  Hospice eligibility pending.  Writer spoke with Lanny Hurst, son, and other family members to initiate education related to hospice philosophy, services, and team approach to care.  Family voiced understanding of information provided.  Per discussion, plan is for discharge home by [facility] today or tomorrow.  Please send signed completed DNR form home with patient/family.  Patient will need prescriptions for discharge comfort medications as per PMT note on 06/09/16.  DME needs discussed with family.   Patient has no equipment in the home currently.   Family requested hospital bed 1/2 rails, OBT, BSC to the home.  Declined any other equipment at this time.  [facility] Chartered certified accountant, Gayland Curry, notified and will contact [place] to arrange delivery to the home.  The home address has been verified and is correct in the chart.  Lanny Hurst, son, is the family member to be contacted to arrange time of delivery.  [facility] Referral [facility] aware of the above. Completed d/c summary will need to be faxed to [facility] at [phone] when final.  Please notify [facility] when patient is ready to leave unit at discharge --call [phone] or [phone] after 5pm.  [facility] information and contact numbers have been given to [place] during visit.  Above information shared with Vida Roller, [facility].    Please feel free to call me with any questions.  Thank you for the referral.  Edyth Gunnels, RN, [facility] Liaison [phone]  All hospital liaison's are now on [place].

## 2016-06-09 NOTE — Progress Notes (Signed)
Went to patient's room. She appears comfortable. Family met with hospice hospital liaison today and completed appropriate paperwork for transition to hospice.   Appreciate excellent care of inpatient team.   Adin Hector, MD, MPH PGY-2 [place] [facility] Pager [phone]

## 2016-06-09 NOTE — Progress Notes (Signed)
STROKE TEAM PROGRESS NOTE   SUBJECTIVE (INTERVAL HISTORY) No family at bedside. Pt still global aphasia and right hemiplegia. Palliative care worked with family and will setup for home hospice.     OBJECTIVE Temp:  [97.5 F (36.4 C)-98.8 F (37.1 C)] 97.5 F (36.4 C) (02/28 1012) Pulse Rate:  [72-86] 78 (02/28 1012) Cardiac Rhythm: Normal sinus rhythm (02/28 0700) Resp:  [16-19] 19 (02/28 1012) BP: (136-170)/(78-91) 136/91 (02/28 1012) SpO2:  [95 %-99 %] 98 % (02/28 1012)  CBC:   Recent Labs Lab 06/07/16 0222 06/08/16 0459  WBC 15.6* 15.0*  HGB 10.6* 11.7*  HCT 32.5* 35.1*  MCV 84.6 84.2  PLT 81* 59*    Basic Metabolic Panel:   Recent Labs Lab 06/05/16 0215 06/07/16 0222  NA 140 139  K 3.2* 3.4*  CL 100* 104  CO2 31 29  GLUCOSE 390* 268*  BUN 27* 21*  CREATININE 0.97 0.86  CALCIUM 9.0 8.7*    Lipid Panel:     Component Value Date/Time   CHOL 197 12/26/2012 0905   TRIG 68 12/26/2012 0905   HDL 63 12/26/2012 0905   CHOLHDL 3.1 12/26/2012 0905   VLDL 14 12/26/2012 0905   LDLCALC 120 (H) 12/26/2012 0905   HgbA1c:  Lab Results  Component Value Date   HGBA1C 12.8 (H) 06/05/2016   Urine Drug Screen: No results found for: LABOPIA, COCAINSCRNUR, LABBENZ, AMPHETMU, THCU, LABBARB    IMAGING I have personally reviewed the radiological images below and agree with the radiology interpretations.  Ct Head Code Stroke Wo Contrast 06/08/2016 1. Acute 1 cm hemorrhage RIGHT cerebellum, with some extension to the RIGHT tentorium. This could be spontaneous (due to anticoagulation), or hypertensive. Trauma not excluded. 2. ASPECTS is 10.   Ct Angio Head W Or Wo Contrast Ct Angio Neck W Or Wo Contrast 06/08/2016 No extracranial or proximal intracranial stenosis of significance. High-grade nonocclusive stenosis at the origin of the LEFT callosomarginal artery, LEFT ACA A3 segment. RIGHT cerebellar hemorrhage redemonstrated.   Ct Cerebral Perfusion W  Contrast 06/08/2016 LEFT medial frontal lobe ACA territory mismatch, 25 mL as described.   2-D echocardiogram - Left ventricle: The cavity size ws normal. There was mild focal basal hypertrophy of the septum. Systolic function was mildly reduced. The estimated ejection fraction was 50%. There is akinesis of the basal-midinferolateral myocardium. Doppler parameters are consistent with abnormal left ventricular relaxation (grade 1 diastolic dysfunction). - Mitral valve: There was severe regurgitation. - Left atrium: The atrium was moderately dilated. - Right ventricle: The cavity size was below normal. Wall thickness was normal. - Atrial septum: There was an atrial septal aneurysm. - Inferior vena cava: The vessel was small, appearing collapsed, consistent with low central venous pressure. The vessel appeared externally compressed. Impressions:- LVEF 50% with inferolateral hypokinesis and severe MR. There appears to be a very large cystic structure arising from the liver that is compressing the IVC and the right ventricle. Suggest dedicated abdominal imaging.  Lower extremity Dopplers Findings consistent with acute deep vein thrombosis involving the common femoral, femoral, popliteal, and posterior tibial veins of the right lower extremity and popliteal, posterior tibial, and peroneal veins of the left lower extremity.  CTA chest PE 06/04/16 1. Examination is positive for small sub segmental pulmonary emboli in the right lower lobe and left upper lobe. 2. Mild aneurysmal dilatation of the ascending aorta up to 4.1 cm. Recommend annual imaging followup by CTA or MRA. This recommendation follows 2010 ACCF/AHA/AATS/ACR/ASA/SCA/SCAI/SIR/STS/SVM Guidelines for the Diagnosis and  Management of Patients with Thoracic Aortic Disease. Circulation. 2010; 121: [id number] 3. Multiple pulmonary nodules. Non-contrast chest CT at 3-6 months is recommended. If the nodules are stable at time of repeat CT,  then future CT at 18-24 months (from today's scan) is considered optional for low-risk patients, but is recommended for high-risk patients. This recommendation follows the consensus statement: Guidelines for Management of Incidental Pulmonary Nodules Detected on CT Images: From the Fleischner Society 2017; Radiology 2017; 284:228-243. 4. Multiple ill-defined hypodense liver lesions suspicious for neoplasm. Giant hepatic cyst measuring 20 cm. 5. Possible distal esophageal and GE junction thickening; further evaluation with endoscopy could be obtained.   PHYSICAL EXAM  Temp:  [97.5 F (36.4 C)-98.8 F (37.1 C)] 97.5 F (36.4 C) (02/28 1012) Pulse Rate:  [72-86] 78 (02/28 1012) Resp:  [16-19] 19 (02/28 1012) BP: (136-170)/(78-91) 136/91 (02/28 1012) SpO2:  [95 %-99 %] 98 % (02/28 1012)  General - thin, well developed, in no apparent distress.  Ophthalmologic - Fundi not visualized due to noncooperation.  Cardiovascular - Regular rate and rhythm.  Neuro - Awake alert, able to following with most of the simple commands tested, but no speech output. Not able to name or repeat. No neglect. Blinking bilaterally for visual threat. PERRL, attending to both sides, no gaze preference. Right mild facial droop. Did not open mouth or stick out tongue on command. LUE and LLE spontaneous movement against gravity, LUE 5/5, LLE at least 4/5. RUE drift with 4/5 proximal and distal, RLE flicker withdraw on pain stimulation. No babinski. DTR 1+. Sensation, coordination not cooperative and gait not tested.    ASSESSMENT/PLAN [name] is a 83 y.o. female with history of HTN, HLD, anemia, DM, thrombocytopenia and liver lesion , admitted for days ago with shortness of breath, found to have acute pulmonary embolism, placed on IV heparin. Also found to have liver lesions with possible metastases to the lung. Dx with hypercoagulable state. This am she developed aphasia and right-sided weakness, leg  greater than arm.    Ischemic stroke and cerebellar hemorrhage:  Symptom and imaging suggest left ACA and MCA infarcts likely due to hypercoagulability from metastatic liver cancer, but also found to have small right cerebellar hemorrhage in setting of IV heparin for B LE DVTs and PEs.   Resultant  aphasia and right hemiparesis  Code stroke CT 1cm R cerebellar hemorrhage with some extension to R tentorium. Aspects 10   CTA head and neck High-grade stenosis L callosomarginal artery, L ACA A3  CT perfusion L medial frontal lobe ACA territory mismatch 25 mL  2D Echo  EF 50%. Severe MR. Large cystic structure from liver compressing IVC and R ventricle. No obvious source of embolus   LE doppler - bilateral DVTs  LDL not ordered  HgbA1c 12.8  Was on IV heparin for VTE prophylaxis, now off due to [place]. No SCDs due to b/l DVTs Diet regular Room service appropriate? Yes; Fluid consistency: Thin  No antithrombotic prior to admission, was on heparin IV, now off due to [place]. Not recommending antithrombotics at this time.   Disposition: home hospice    Pulmonary embolism, bilateral Deep vein thrombosis, bilateral  Secondary to hypercoagulable state from cancer  Was on IV herapin, now off due to [place]  Agree with home hospice  Hypertension  Stable  BP goal normotensive  Diabetes  HgbA1c 12.8, goal < 7.0  Uncontrolled  On lantus  SSI  Other Stroke Risk Factors  Advanced age  Former Cigarette  smoker, quit 44 years ago  ETOH use, advised to drink no more than 1 drink(s) a day  Family hx stroke (mother, father)  Other Active Problems   Metastatic cancer to the lungs   Pancreatic temporal mass, liver lesions   Hospital day # 5  Neurology will sign off. Please call with questions. No neuro follow up needed at this time. Thanks for the consult.   Rosalin Hawking, MD PhD Stroke Neurology 06/09/2016 3:28 PM   To contact Stroke Continuity provider, please refer to  [email or web address] After hours, contact General Neurology

## 2016-06-09 NOTE — Progress Notes (Signed)
Events of yesterday noted. I discussed the situation with her son. The plan is for home hospice care.  Please call Oncology as needed.

## 2016-06-09 NOTE — Progress Notes (Signed)
At shift change nurse giving me report was unsure of pt's Q 2 status appears that patient is now starting comfort care no active order for Q2 vitals but Q4 neuro checks are active. Patient resting comfortably w/son in room.

## 2016-06-09 NOTE — Progress Notes (Signed)
Daily Progress Note   Patient Name: Samantha Norton       Date: 06/09/2016 DOB: 07/28/1931  Age: 83 y.o. MRN#: [id number] Attending Physician: Lind Covert, MD Primary Care Physician: Adin Hector, MD Admit Date: 06/04/2016  Reason for Consultation/Follow-up: Establishing goals of care, Non pain symptom management, Pain control and Psychosocial/spiritual support  Subjective: I met today with patient, her son, her sister, and her daughter in law. We discussed clinical course as well as wishes moving forward in light of likely metastatic cancer, bilateral PEs, and new stroke.  We discussed difference between a aggressive medical intervention path and a palliative, comfort focused care path.  Values and goals of care important to patient and family were attempted to be elicited. Concept of hospice was discussed.  Her family is clear in desire to transition home with the support of home hospice.  They would like to use [facility] for agency.  Questions and concerns addressed.   PMT will continue to support holistically.  Length of Stay: 5  Current Medications: Scheduled Meds:  . insulin glargine  25 Units Subcutaneous Q breakfast  . irbesartan  150 mg Oral BID  . sodium chloride flush  3 mL Intravenous Q12H  . sodium chloride flush  3 mL Intravenous Q12H  . traMADol  50 mg Oral Q8H    Continuous Infusions: . sodium chloride 1,000 mL (06/08/16 2217)  . sodium chloride Stopped (06/08/16 2000)    PRN Meds: sodium chloride, acetaminophen **OR** acetaminophen, alum & mag hydroxide-simeth, feeding supplement (ENSURE ENLIVE), ibuprofen, ondansetron **OR** ondansetron (ZOFRAN) IV, polyethylene glycol, sodium chloride flush  Physical Exam      General: Alert, awake, in no acute distress.  Nonverbal. Lungs: Good air movement, clear Abdomen: Soft, nontender, nondistended, positive bowel sounds.  Ext: bilateral LE edema Skin: Warm and dry Neuro: Nonverbal, right side hemiplegia.       Vital Signs: BP (!) (P) 136/91 (BP Location: Left Arm)   Pulse (P) 78   Temp (P) 97.5 F (36.4 C) (Oral)   Resp (P) 19   Ht 5' 1"  (1.549 m)   Wt 58.6 kg (129 lb 3.2 oz)   SpO2 (P) 98%   BMI 24.41 kg/m  SpO2: SpO2: (P) 98 % O2 Device: O2 Device: (P) Not Delivered O2 Flow Rate:  Intake/output summary:  Intake/Output Summary (Last 24 hours) at 06/09/16 1113 Last data filed at 06/09/16 7673  Gross per 24 hour  Intake          1331.34 ml  Output              400 ml  Net           931.34 ml   LBM: Last BM Date: 06/06/16 Baseline Weight: Weight: 54.8 kg (120 lb 12.8 oz) Most recent weight: Weight: 58.6 kg (129 lb 3.2 oz)       Palliative Assessment/Data:    Flowsheet Rows   Flowsheet Row Most Recent Value  Intake Tab  Referral Department  Hospitalist  Unit at Time of Referral  Med/Surg Unit  Palliative Care Primary Diagnosis  Cancer  Date Notified  06/04/16  Palliative Care Type  New Palliative care  Reason for referral  Clarify Goals of Care  Date of Admission  06/04/16  Date first seen by Palliative Care  06/05/16  # of days Palliative referral response time  1 Day(s)  # of days IP prior to Palliative referral  0  Clinical Assessment  Palliative Performance Scale Score  40%  Pain Max last 24 hours  6  Pain Min Last 24 hours  4  Dyspnea Max Last 24 Hours  4  Dyspnea Min Last 24 hours  3  Nausea Max Last 24 Hours  3  Psychosocial & Spiritual Assessment  Palliative Care Outcomes  Patient/Family meeting held?  Yes  Who was at the meeting?  patient daughter in law.   Palliative Care Outcomes  Clarified goals of care      Patient Active Problem List   Diagnosis Date Noted  . Intracerebral hemorrhage 06/08/2016  . Liver metastases ([place])   . Pancreatic mass   .  Pulmonary embolus ([place]) 06/04/2016  . Hyperglycemia   . Liver lesion 05/26/2016  . Abdominal pain 05/25/2016  . Diabetes ([place]) 03/02/2016  . Fatigue 01/12/2016  . Health care maintenance 01/31/2015  . Borderline type 2 diabetes mellitus 01/31/2015  . Dupuytren contracture 01/05/2013  . Hypertension   . Hyperlipidemia   . Anemia     Palliative Care Assessment & Plan   Recommendations/Plan:  Plan for d/c home with hospice support.  Family presented choices by CM this am and would like [facility]. CM notified.  Hopeful for discharge today if possible.    Pain or Shortness of breath: Currently well controlled, but she has been having more difficulty with pills and son requested liquid medication. - Recommend start Morphine Intensol 67m/ml; 5671m(1/71m77mQ2hr sublingual prn pain or shortness of breath; Dispense 78m66m d/c.  Agitation: Currently not an issue for patient.  Recommend medications for d/c for use as needed is symptoms arise -Recommend start Ativan Intensol 2mg/61m give 0.5mg Q771msublingual prn agitation; Dispense 78mL o45mc. -Recommend start Haldol Intensol 2mg/ml;22mve 0.5mg Q2H 23mlingual prn agitation or nausea; Dispense 78mL on d83m Nausea: Currently not an issue for patient.  Recommend medications for d/c for use as needed is symptoms arise -Recommend start Haldol Intensol 2mg/ml; gi271m0.5mg Q2H sub38mgual prn agitation or nausea; Dispense 78mL on d/c.45mcretions: Currently not an issue for patient.  Recommend medications for d/c for use as needed if symptoms arise. -Recommend Atropine 1% eye drops (2 drops) sublingual Q4H sublingual prn secretions; Dispense 5ml on d/c.  471mel regimen:  With narcotic usage, recommend bowel regimen on d/c.  -Recommend miralax daily PRN.  Goals of Care and Additional Recommendations:  Limitations on Scope of Treatment: Avoid Hospitalization and Full Comfort Care  Code Status:    Code Status Orders        Start     Ordered    06/04/16 2024  Do not attempt resuscitation (DNR)  Continuous    Question Answer Comment  In the event of cardiac or respiratory ARREST Do not call a "code blue"   In the event of cardiac or respiratory ARREST Do not perform Intubation, CPR, defibrillation or ACLS   In the event of cardiac or respiratory ARREST Use medication by any route, position, wound care, and other measures to relive pain and suffering. May use oxygen, suction and manual treatment of airway obstruction as needed for comfort.      06/04/16 2023    Code Status History    Date Active Date Inactive Code Status Order ID Comments User Context   This patient has a current code status but no historical code status.       Prognosis:   < 6 months  Discharge Planning:  Home with Hospice  Care plan was discussed with patient, family  Thank you for allowing the Palliative Medicine Team to assist in the care of this patient.   Total Time 45 Prolonged Time Billed No      Greater than 50%  of this time was spent counseling and coordinating care related to the above assessment and plan.  Micheline Rough, MD   Please contact Palliative Medicine Team phone at [phone] for questions and concerns.

## 2016-06-09 NOTE — Telephone Encounter (Signed)
[name] from Hospice would like to know if Dr. Avon Gully would be willing to be pt's attending for Hospice. ep

## 2016-06-09 NOTE — Telephone Encounter (Signed)
[name] from hospice called to see if Dr. Benay Spice will be the attending for hospice, pt in hospital right now, hospice expects to see pt tomorrow. Dr. Benay Spice will be attending.

## 2016-06-09 NOTE — Care Management Note (Signed)
Case Management Note  Patient Details  Name: NEFTALI MAGNIN MRN: [id number] Date of Birth: April 27, 1931  Subjective/Objective:                    Action/Plan: This am CM provided the family with a list of Home hospice agencies in [place]. Family asked to research the list.  CM received phone call from Palliative care that the family had decided on [facility] [place]. [name] with [facility] notified and will be up to see the family this afternoon. Family is requesting a bed for home and [name] informed.  CM following for further d/c needs.    Expected Discharge Date:  06/06/16               Expected Discharge Plan:  [place]  In-House Referral:     Discharge planning Services  CM Consult, Medication Assistance  Post Acute Care Choice:  Hospice Choice offered to:  Adult Children  DME Arranged:    DME Agency:     HH Arranged:    HH Agency:  [facility]  Status of Service:  In process, will continue to follow  If discussed at Long Length of Stay Meetings, dates discussed:    Additional Comments:  Pollie Friar, RN 06/09/2016, 11:44 AM

## 2016-06-09 NOTE — Progress Notes (Signed)
Family Medicine Teaching Service Daily Progress Note Intern Pager: [phone]  Patient name: Samantha Norton Medical record number: [id number] Date of birth: 01-06-1932 Age: 83 y.o. Gender: female  Primary Care Provider: Adin Hector, MD Consultants: Palliative, chaplain, IR Code Status: DNR  Pt Overview and Major Events to Date:  2/23 - Admitted with dyspnea and n/v 2/27- acute hemorrhagic stroke in right cerebellum, transfer to neuro floor  Assessment and Plan: Samantha Norton is a 83 y.o. female presenting with SOB and n/v. PMH is significant for HTN, HLD, anemia, T2DM, thrombocytopenia, and liver lesion.   Aphasia and right leg weakness- noted early morning on 2/27 by nursing staff when helping patient to bathroom. Rapid response/code stroke called. CT head with high-grade nonocclusive stenosis at the origin of the LEFT callosomarginal artery, LEFT ACA A3 segment. RIGHT cerebellar hemorrhage redemonstrated. Neurology saw patient. Patient currently stable.  -at this time patient is pursuing comfort care  Dyspnea in setting of Acute PE and probable metastatic cancer to lungs- improving- CTA chest showed small segmental pulmonary emboli in the right lower lobe and left upper lobe. No CHF as Echo normal. Liver lesion could also be contributing to dyspnea as this is pushing against her IVC.  -comfort care  Pancreatic tail mass, Liver lesions- Largest measuring 20 cm, likely malignant in etiology per radiology read, evidence of possible metastasis to lungs. Also multiple other hepatic lesions. Elevated alk phos, LFT's, lactic acid on admission. Albumin low. Likely advanced disease. AFP normal.  -consult to palliative care, oncology, and chaplin per patient wishes -toradol 50 mg q6H for pain  Lower extremity swelling- improving- secondary to liver mass compressing IVC. Echo with compression of IVC, normal EF, G1DD -elevate legs  HTN- stable- On Avalide (irbesartan-HCTZ 150-12.5 BID) at  home. BP this morning 151/89 -hold home BP meds as patient is comfort care  T2DM- uncontrolled- Last A1C 14.3 in November 2017. CBG's have been elevated. A1C 12.8 -continue lantus 25 units to avoid HHS/DKA -will likely discontinue insulin medications going forward  FEN/GI: regular diet Prophylaxis: none  Disposition: home pending home hospice set up  Subjective:  Samantha Norton is unable to speak but shakes head to being in pain. No needs identified. Family at bedside. Palliative and hospice set up pending at this time.  Objective: Temp:  [97.5 F (36.4 C)-98.8 F (37.1 C)] 97.5 F (36.4 C) (02/28 0500) Pulse Rate:  [72-86] 72 (02/28 0500) Resp:  [16-18] 18 (02/28 0500) BP: (138-170)/(74-89) 138/78 (02/28 0500) SpO2:  [95 %-100 %] 95 % (02/28 0500) Physical Exam: General: elderly lady laying in bed in NAD Respiratory: CTAB no increased work of breathing, on room air Gastrointestinal: hepatomegaly, Non-tender. Non-distended. MSK: +1 pitting edema in feet bilaterally.  Neuro: non-verbal.  Laboratory:  Recent Labs Lab 06/06/16 0314 06/07/16 0222 06/08/16 0459  WBC 17.3* 15.6* 15.0*  HGB 11.8* 10.6* 11.7*  HCT 34.4* 32.5* 35.1*  PLT 61* 81* 59*    Recent Labs Lab 06/04/16 1418 06/05/16 0215 06/07/16 0222  NA 138 140 139  K 3.5 3.2* 3.4*  CL 97* 100* 104  CO2 28 31 29   BUN 27* 27* 21*  CREATININE 0.94 0.97 0.86  CALCIUM 9.6 9.0 8.7*  PROT 6.0* 5.3* 4.8*  BILITOT 2.1* 1.7* 1.5*  ALKPHOS 275* 232* 253*  ALT 84* 70* 81*  AST 90* 73* 87*  GLUCOSE 440* 390* 268*   TSH 1.2 D-dimer >20 BNP 313 I-stat trop neg Lactic Acid 2.71 >>1.7 A1C 12.8 AFP 3.9 (normal)  Imaging/Diagnostic Tests: Ct Angio Head W Or Wo Contrast  Result Date: 06/08/2016 CLINICAL DATA:  Aphasia.  RIGHT hemiparesis. EXAM: CT ANGIOGRAPHY HEAD AND NECK TECHNIQUE: Multidetector CT imaging of the head and neck was performed using the standard protocol during bolus administration of intravenous  contrast. Multiplanar CT image reconstructions and MIPs were obtained to evaluate the vascular anatomy. Carotid stenosis measurements (when applicable) are obtained utilizing NASCET criteria, using the distal internal carotid diameter as the denominator. CONTRAST:  Isovue 370, 50 mL. COMPARISON:  CT perfusion reported separately. FINDINGS: CTA NECK Aortic arch: Standard branching. Imaged portion shows no evidence of aneurysm or dissection. No significant stenosis of the major arch vessel origins. Right carotid system: Minor atheromatous change at the bifurcation. No evidence of dissection, stenosis (50% or greater) or occlusion. Left carotid system: Minor atheromatous change the bifurcation. No evidence of dissection, stenosis (50% or greater) or occlusion. Vertebral arteries: Dominant LEFT vertebral Diminutive RIGHT vertebral. No evidence of dissection, stenosis (50% or greater) or occlusion. CTA HEAD Anterior circulation: No significant stenosis of the internal carotid arteries. Both middle cerebral arteries are widely patent. There is a high-grade stenosis at the origin of the LEFT callosomarginal artery from the pericallosal artery, LEFT anterior cerebral artery A3 segment. This is nonocclusive. No  aneurysm, or vascular malformation. Posterior circulation: LEFT vertebral dominant contributor to basilar; RIGHT vertebral contributes to PICA. No significant stenosis, proximal occlusion, aneurysm, or vascular malformation. Venous sinuses: As permitted by contrast timing, patent. Anatomic variants: None of significance. Post infusion imaging the head was not performed, but on axial thin-section imaging, RIGHT cerebellar hemorrhage redemonstrated. Review of the MIP images confirms the above findings IMPRESSION: No extracranial or proximal intracranial stenosis of significance. High-grade nonocclusive stenosis at the origin of the LEFT callosomarginal artery, LEFT ACA A3 segment. RIGHT cerebellar hemorrhage  redemonstrated. Recommend continued surveillance. Electronically Signed   By: Staci Righter M.D.   On: 06/08/2016 08:11   Ct Angio Neck W Or Wo Contrast  Result Date: 06/08/2016 CLINICAL DATA:  Aphasia.  RIGHT hemiparesis. EXAM: CT ANGIOGRAPHY HEAD AND NECK TECHNIQUE: Multidetector CT imaging of the head and neck was performed using the standard protocol during bolus administration of intravenous contrast. Multiplanar CT image reconstructions and MIPs were obtained to evaluate the vascular anatomy. Carotid stenosis measurements (when applicable) are obtained utilizing NASCET criteria, using the distal internal carotid diameter as the denominator. CONTRAST:  Isovue 370, 50 mL. COMPARISON:  CT perfusion reported separately. FINDINGS: CTA NECK Aortic arch: Standard branching. Imaged portion shows no evidence of aneurysm or dissection. No significant stenosis of the major arch vessel origins. Right carotid system: Minor atheromatous change at the bifurcation. No evidence of dissection, stenosis (50% or greater) or occlusion. Left carotid system: Minor atheromatous change the bifurcation. No evidence of dissection, stenosis (50% or greater) or occlusion. Vertebral arteries: Dominant LEFT vertebral Diminutive RIGHT vertebral. No evidence of dissection, stenosis (50% or greater) or occlusion. CTA HEAD Anterior circulation: No significant stenosis of the internal carotid arteries. Both middle cerebral arteries are widely patent. There is a high-grade stenosis at the origin of the LEFT callosomarginal artery from the pericallosal artery, LEFT anterior cerebral artery A3 segment. This is nonocclusive. No  aneurysm, or vascular malformation. Posterior circulation: LEFT vertebral dominant contributor to basilar; RIGHT vertebral contributes to PICA. No significant stenosis, proximal occlusion, aneurysm, or vascular malformation. Venous sinuses: As permitted by contrast timing, patent. Anatomic variants: None of significance.  Post infusion imaging the head was not performed, but on axial thin-section  imaging, RIGHT cerebellar hemorrhage redemonstrated. Review of the MIP images confirms the above findings IMPRESSION: No extracranial or proximal intracranial stenosis of significance. High-grade nonocclusive stenosis at the origin of the LEFT callosomarginal artery, LEFT ACA A3 segment. RIGHT cerebellar hemorrhage redemonstrated. Recommend continued surveillance. Electronically Signed   By: Staci Righter M.D.   On: 06/08/2016 08:11   Ct Cerebral Perfusion W Contrast  Result Date: 06/08/2016 CLINICAL DATA:  New onset of aphasia.  RIGHT-sided weakness. EXAM: CT PERFUSION BRAIN TECHNIQUE: Multiphase CT imaging of the brain was performed following IV bolus contrast injection. Subsequent parametric perfusion maps were calculated using RAPID software. CONTRAST:  40 mL Isovue 370. COMPARISON:  Code stroke CT reported earlier today. CTA head neck reported separately. FINDINGS: CT Brain Perfusion Findings: CBF (<30%) Volume: 32m Perfusion (Tmax>6.0s) volume: 239mMismatch Volume: 2529mnfarction/ischemic Location:Medial frontal lobe, LEFT anterior cerebral artery territory. IMPRESSION: LEFT medial frontal lobe ACA territory mismatch, 25 mL as described. Findings discussed with Stroke neurologist at 7:40 a.m. Electronically Signed   By: JohStaci RighterD.   On: 06/08/2016 07:48    AngSteve RattlerO 06/09/2016, 7:24 AM PGY-1, [facility] pager: [phone] pages welcome

## 2016-06-10 ENCOUNTER — Telehealth: Payer: Self-pay | Admitting: Endocrinology

## 2016-06-10 ENCOUNTER — Other Ambulatory Visit: Payer: Self-pay | Admitting: Internal Medicine

## 2016-06-10 MED ORDER — ATROPINE SULFATE 1 % OP SOLN: 1.0000 [drp] | OPHTHALMIC | 12 refills | Status: AC | PRN

## 2016-06-10 MED ORDER — MORPHINE SULFATE (CONCENTRATE) 10 MG /0.5 ML PO SOLN: 5.0000 mg | ORAL | 0 refills | Status: AC | PRN

## 2016-06-10 MED ORDER — ALUM & MAG HYDROXIDE-SIMETH 200-200-20 MG/5ML PO SUSP: 15.0000 mL | ORAL | 0 refills | Status: AC | PRN

## 2016-06-10 MED ORDER — HALOPERIDOL LACTATE 2 MG/ML PO CONC: 2.0000 mg | ORAL | 0 refills | Status: AC | PRN

## 2016-06-10 MED ORDER — HALOPERIDOL LACTATE 2 MG/ML PO CONC: 2.0000 mg | ORAL | 0 refills | Status: DC | PRN

## 2016-06-10 MED ORDER — ACETAMINOPHEN 650 MG RE SUPP: 650.0000 mg | Freq: Four times a day (QID) | RECTAL | 0 refills | Status: AC | PRN

## 2016-06-10 MED ORDER — ENSURE ENLIVE PO LIQD: 237.0000 mL | Freq: Two times a day (BID) | ORAL | 12 refills | Status: AC | PRN

## 2016-06-10 MED ORDER — ATROPINE SULFATE 1 % OP SOLN: 1.0000 [drp] | OPHTHALMIC | 12 refills | Status: DC | PRN

## 2016-06-10 MED ORDER — LORAZEPAM 2 MG/ML PO CONC: 0.5000 mg | ORAL | 0 refills | Status: AC | PRN

## 2016-06-10 NOTE — Telephone Encounter (Signed)
No follow up necessary.  

## 2016-06-10 NOTE — Care Management Note (Signed)
Case Management Note  Patient Details  Name: Samantha Norton MRN: [id number] Date of Birth: [birth year]  Subjective/Objective:                    Action/Plan: Pt discharging home with [facility]. Family states equipment has been delivered to the home. Family requesting PTAR transportation home. CM arranged PTAR at 1150 am. Prescriptions, DNR form, and transportation form on the front of patients chart. D/C summary faxed to [facility] and they were informed of her d/c.   Expected Discharge Date:  06/10/16               Expected Discharge Plan:  [place]  In-House Referral:     Discharge planning Services  CM Consult, Medication Assistance  Post Acute Care Choice:  Hospice Choice offered to:  Adult Children  DME Arranged:    DME Agency:     HH Arranged:    HH Agency:  [facility]  Status of Service:  Completed, signed off  If discussed at [place] of Stay Meetings, dates discussed:    Additional Comments:  Pollie Friar, RN 06/10/2016, 2:13 PM

## 2016-06-10 NOTE — Telephone Encounter (Signed)
Patient no showed today's appt. Please advise on how to follow up. °A. No follow up necessary. °B. Follow up urgent. Contact patient immediately. °C. Follow up necessary. Contact patient and schedule visit in ___ days. °D. Follow up advised. Contact patient and schedule visit in ____weeks. ° °

## 2016-06-11 DIAGNOSIS — Z515 Encounter for palliative care: Secondary | ICD-10-CM

## 2016-06-11 NOTE — Progress Notes (Signed)
Daily Progress Note   Patient Name: Samantha Norton       Date: 06/11/2016 DOB: May 24, 1931  Age: 83 y.o. MRN#: [id number] Attending Physician: No att. providers found Primary Care Physician: Adin Hector, MD Admit Date: 06/04/2016  Reason for Consultation/Follow-up: Establishing goals of care  Subjective: Patient resting in bed on entering room.  She remains nonverbal, but interactive, smiles, nods appropriately.    Multiple family members in the room, including her sons and daugthers who have arrived from [place].  We discussed her clinical course and reviewed conversation from yesterday when she indicated that she wants to transition home with hospice support.    Her family who was not present for meeting yesterday had multiple questions regarding diagnosis, prognosis, and plan moving forward for comfort care at home with the support of hospice.    Length of Stay: 6  Current Medications: Scheduled Meds:    Continuous Infusions:   PRN Meds:   Physical Exam   General: Alert, awake, in no acute distress. Aphasic but interactive. HEENT: No bruits, no goiter, no JVD Heart: Regular rate and rhythm. No murmur appreciated. Lungs: Good air movement, clear Abdomen: Soft, nontender, nondistended, positive bowel sounds.  Ext: No significant edema Skin: Warm and dry.         Vital Signs: BP 120/73 (BP Location: Left Arm)   Pulse 87   Temp 98.6 F (37 C) (Oral)   Resp 18   Ht 5' 1"  (1.549 m)   Wt 58.6 kg (129 lb 3.2 oz)   SpO2 95%   BMI 24.41 kg/m  SpO2: SpO2: 95 % O2 Device: O2 Device: Not Delivered O2 Flow Rate:    Intake/output summary: No intake or output data in the 24 hours ending 06/11/16 2112 LBM: Last BM Date: 06/06/16 Baseline Weight: Weight: 54.8 kg (120  lb 12.8 oz) Most recent weight: Weight: 58.6 kg (129 lb 3.2 oz)       Palliative Assessment/Data:    Flowsheet Rows   Flowsheet Row Most Recent Value  Intake Tab  Referral Department  Hospitalist  Unit at Time of Referral  Med/Surg Unit  Palliative Care Primary Diagnosis  Cancer  Date Notified  06/04/16  Palliative Care Type  New Palliative care  Reason for referral  Clarify Goals of Care  Date of Admission  06/04/16  Date first seen by Palliative Care  06/05/16  # of days Palliative referral response time  1 Day(s)  # of days IP prior to Palliative referral  0  Clinical Assessment  Palliative Performance Scale Score  40%  Pain Max last 24 hours  6  Pain Min Last 24 hours  4  Dyspnea Max Last 24 Hours  4  Dyspnea Min Last 24 hours  3  Nausea Max Last 24 Hours  3  Psychosocial & Spiritual Assessment  Palliative Care Outcomes  Patient/Family meeting held?  Yes  Who was at the meeting?  patient daughter in law.   Palliative Care Outcomes  Clarified goals of care  Actual Discharge Date  06/10/16 [home with hospice]      Patient Active Problem List   Diagnosis Date Noted  . Stroke ([place])   . Acute deep vein thrombosis (DVT) of distal vein of lower extremity ([facility])   . Intracerebral hemorrhage 06/08/2016  . Liver metastases ([place])   . Pancreatic mass   . Pulmonary embolus ([place]) 06/04/2016  . Hyperglycemia   . Liver lesion 05/26/2016  . Abdominal pain 05/25/2016  . Diabetes ([place]) 03/02/2016  . Fatigue 01/12/2016  . Health care maintenance 01/31/2015  . Borderline type 2 diabetes mellitus 01/31/2015  . Dupuytren contracture 01/05/2013  . Hypertension   . Hyperlipidemia   . Anemia     Palliative Care Assessment & Plan   Recommendations/Plan:  Home with hospice support.  Met with family and answered all questions regarding care plan moving forward to the best of my ability.  Goals of Care and Additional Recommendations:  Limitations on Scope of Treatment: Full  Comfort Care  Code Status: Code Status History    Date Active Date Inactive Code Status Order ID Comments User Context   06/04/2016  8:23 PM 06/10/2016  3:40 PM DNR [id number]  Steve Rattler, DO ED    Questions for Most Recent Historical Code Status (Order [id number])    Question Answer Comment   In the event of cardiac or respiratory ARREST Do not call a "code blue"    In the event of cardiac or respiratory ARREST Do not perform Intubation, CPR, defibrillation or ACLS    In the event of cardiac or respiratory ARREST Use medication by any route, position, wound care, and other measures to relive pain and suffering. May use oxygen, suction and manual treatment of airway obstruction as needed for comfort.        Prognosis:   < 6 months  Discharge Planning:  Home with Hospice  Care plan was discussed with family  Thank you for allowing the Palliative Medicine Team to assist in the care of this patient.   Total Time 30 Prolonged Time Billed No      Greater than 50%  of this time was spent counseling and coordinating care related to the above assessment and plan.  Micheline Rough, MD  Please contact Palliative Medicine Team phone at [phone] for questions and concerns.

## 2016-06-15 ENCOUNTER — Telehealth: Payer: Self-pay | Admitting: Oncology

## 2016-06-15 ENCOUNTER — Telehealth: Payer: Self-pay | Admitting: *Deleted

## 2016-06-15 NOTE — Telephone Encounter (Signed)
Horris Latino from hospice called to inform us of the death of Samantha Norton on July 08, 2016 at 3:20 pm at home

## 2016-06-15 NOTE — Telephone Encounter (Signed)
Called Providing Crystal with this information.  "I will call Hospice back in the morning."

## 2016-06-15 NOTE — Telephone Encounter (Signed)
Should go to primary hospice MD

## 2016-06-15 NOTE — Telephone Encounter (Signed)
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 We picked up the body of Shira Paduano yesterday from the home.  Calling to confirm as we were told Dr. Benay Spice will sign the Death Certificate.  Please call and let us know."

## 2016-06-22 ENCOUNTER — Ambulatory Visit: Payer: Federal, State, Local not specified - PPO | Admitting: Internal Medicine

## 2016-07-11 DEATH — deceased

## 2017-03-23 ENCOUNTER — Other Ambulatory Visit: Payer: Federal, State, Local not specified - PPO

## 2017-03-23 ENCOUNTER — Ambulatory Visit: Payer: Federal, State, Local not specified - PPO | Admitting: Oncology

## 2017-07-06 IMAGING — CT CT HEAD CODE STROKE
3 of 4 series · 16 of 47 positions shown, 19 images · non-contrast
Comparison: None.

CLINICAL DATA: Code stroke. RIGHT-sided weakness and aphasia.
Hypertension. No reported recent fall. Patient on anticoagulation
for DVT.

EXAM:
CT HEAD WITHOUT CONTRAST
TECHNIQUE: Contiguous axial images were obtained from the base of the skull
through the vertex without intravenous contrast.

[Series 201: head w/o, idose (1) · axial · non-contrast · 0.49mm/px · z∈[+146,+276]mm · 10 of 32 slices shown, 13 images]
[im 3/32  brain]
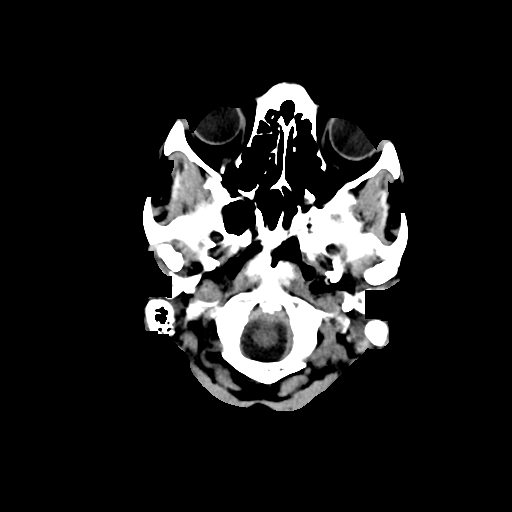
[im 3/32  bone]
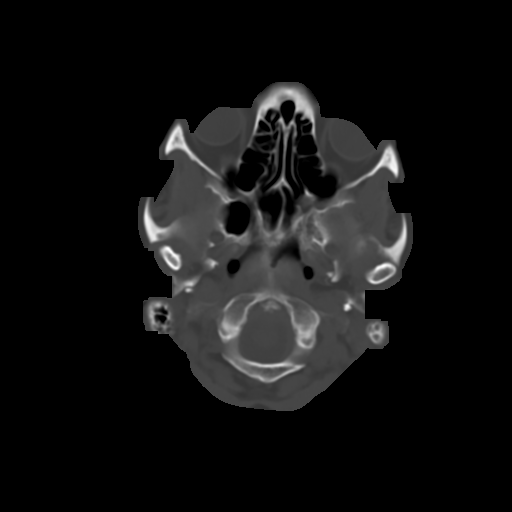
[im 5/32  brain]
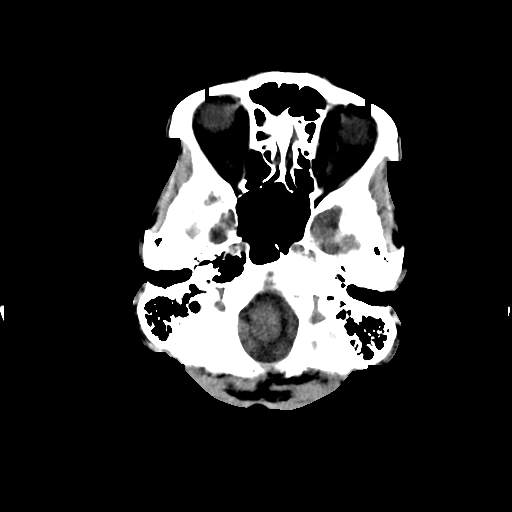
[im 9/32  brain]
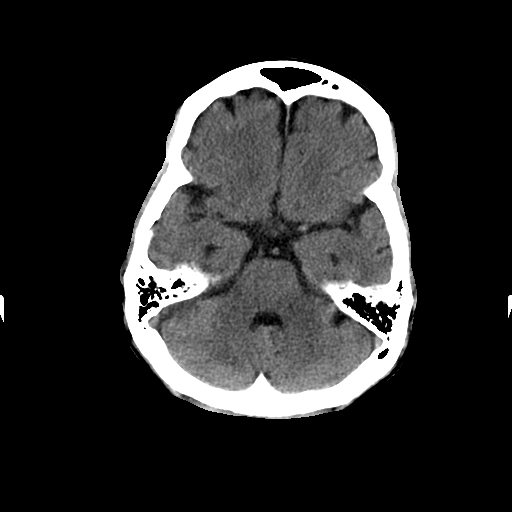
[im 12/32  brain]
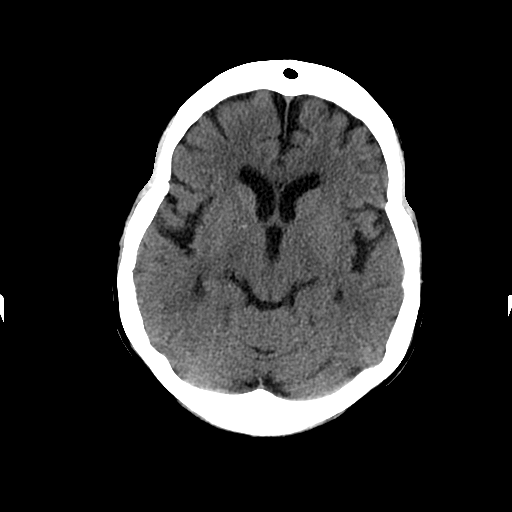
[im 14/32  brain]
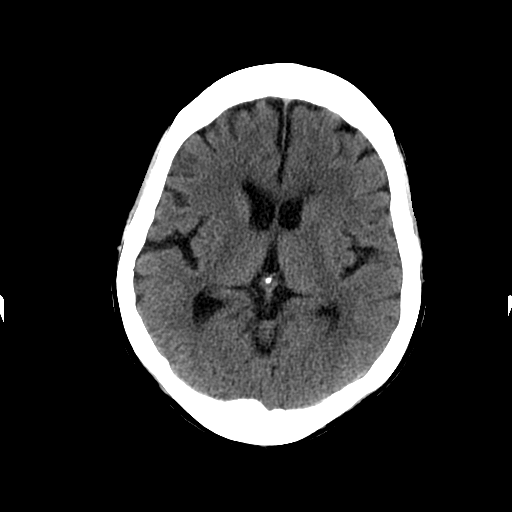
[im 14/32  bone]
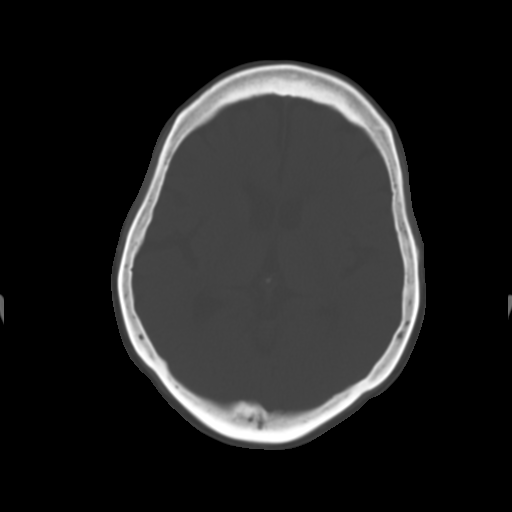
[im 18/32  brain]
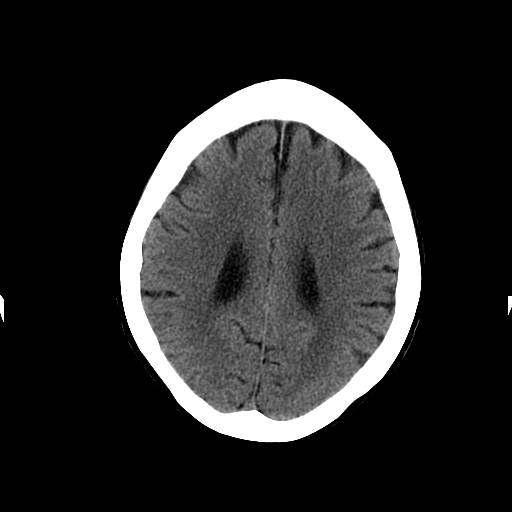
[im 20/32  brain]
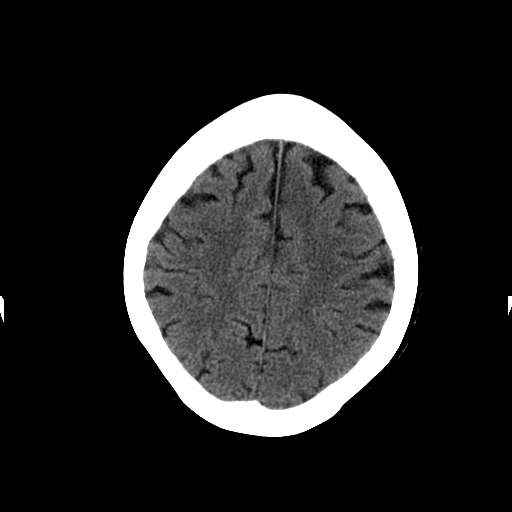
[im 23/32  brain]
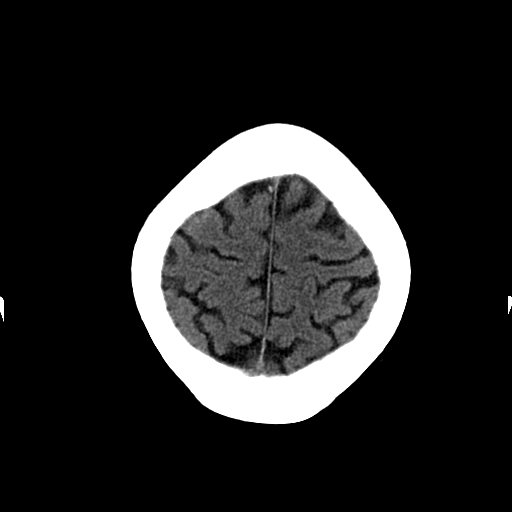
[im 27/32  brain]
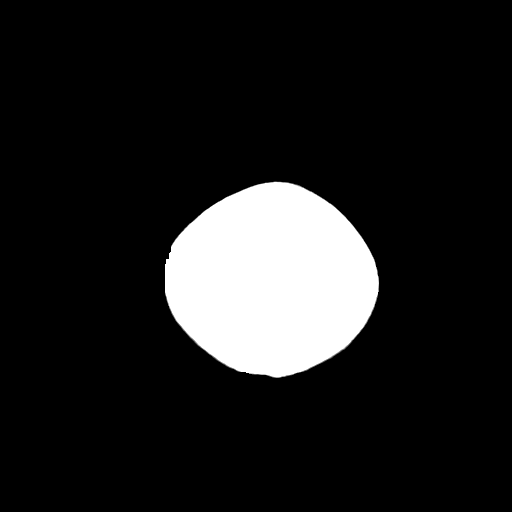
[im 27/32  bone]
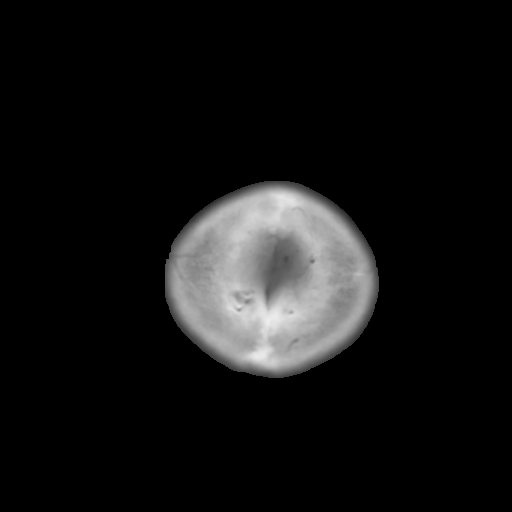
[im 29/32  brain]
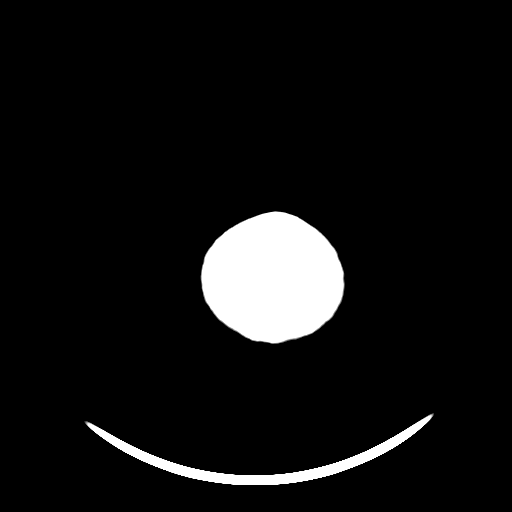

[Series 203: coronal st, idose (1) · coronal · 0.40mm/px · 3 of 75 slices shown]
[im 25/75  brain]
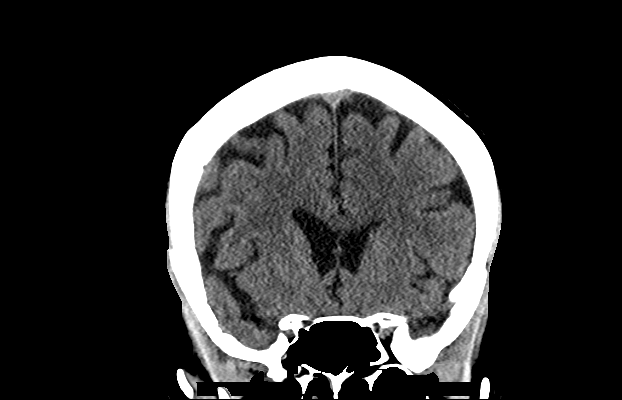
[im 33/75  brain]
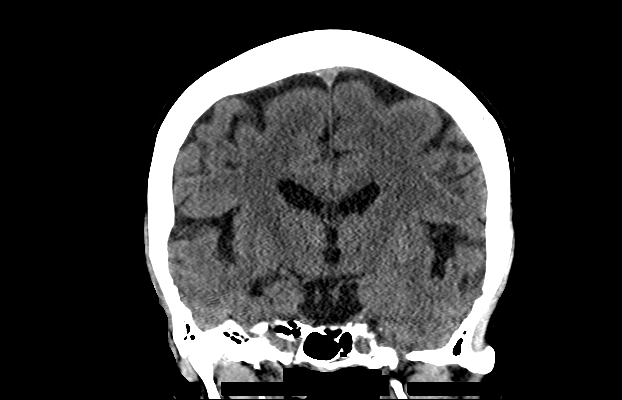
[im 42/75  brain]
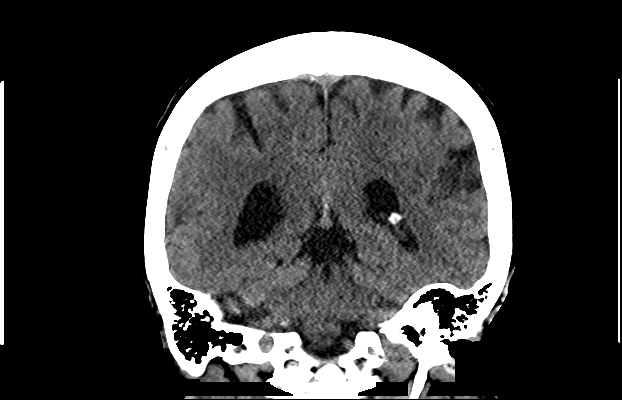

[Series 204: sagittal st, idose (1) · sagittal · 0.40mm/px · 3 of 82 slices shown]
[im 28/82  brain]
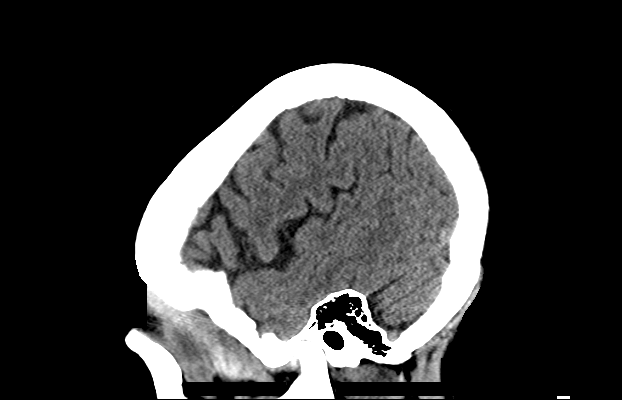
[im 41/82  brain]
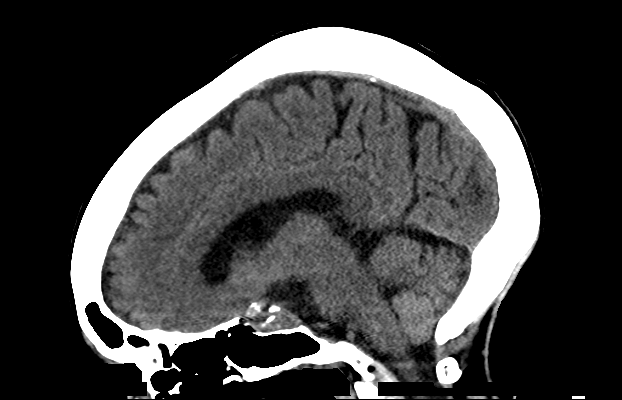
[im 55/82  brain]
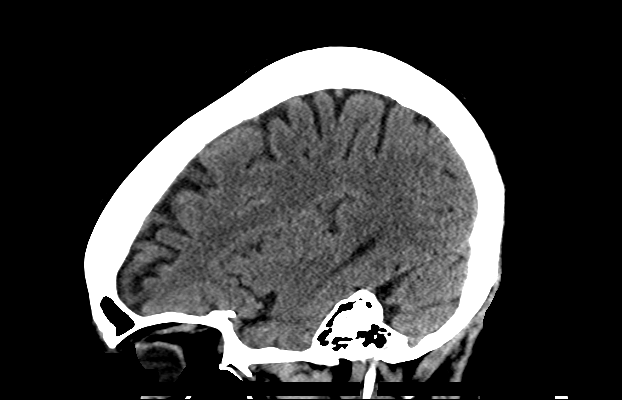

[16 of 47 positions shown; findings below may reference images not displayed]

FINDINGS: Brain: Roughly 1 cm size hemorrhage, RIGHT cerebellar subcortical
white matter, mild surrounding edema, slight extension to the
tentorium on the RIGHT.

No visible acute stroke, mass lesion, or hydrocephalus. Moderate
atrophy. Chronic microvascular ischemic change of the white matter.

Vascular: Carotid siphon vascular calcification. No hyperdense
vessel.

Skull: Normal. Negative for fracture or focal lesion.

Sinuses/Orbits: No acute finding.

Other: None.

ASPECTS (Alberta Stroke Program Early CT Score)

- Ganglionic level infarction (caudate, lentiform nuclei, internal
capsule, insula, M1-M3 cortex): 7

- Supraganglionic infarction (M4-M6 cortex): 3

Total score (0-10 with 10 being normal): 10
IMPRESSION: 1. Acute 1 cm hemorrhage RIGHT cerebellum, with some extension to
the RIGHT tentorium. This could be spontaneous (due to
anticoagulation), or hypertensive. Trauma not excluded.

2. ASPECTS is 10.

Critical Value/emergent results were called by telephone at the time
of interpretation on 06/08/2016 at [DATE] to Dr. JOVANOVIC TRANSPORT KOPANJA , who
verbally acknowledged these results.
